# Patient Record
Sex: Female | Born: 1985 | Race: White | Hispanic: No | Smoking: Never smoker
Health system: Southern US, Community
[De-identification: ages and names within clinical notes are randomized; demographics above are authoritative.]

## PROBLEM LIST (undated history)

## (undated) DIAGNOSIS — IMO0002 Reserved for concepts with insufficient information to code with codable children: Secondary | ICD-10-CM

## (undated) DIAGNOSIS — R87619 Unspecified abnormal cytological findings in specimens from cervix uteri: Secondary | ICD-10-CM

## (undated) DIAGNOSIS — T7840XA Allergy, unspecified, initial encounter: Secondary | ICD-10-CM

## (undated) HISTORY — DX: Allergy, unspecified, initial encounter: T78.40XA

## (undated) HISTORY — DX: Unspecified abnormal cytological findings in specimens from cervix uteri: R87.619

## (undated) HISTORY — DX: Reserved for concepts with insufficient information to code with codable children: IMO0002

---

## 2010-12-27 ENCOUNTER — Ambulatory Visit (INDEPENDENT_AMBULATORY_CARE_PROVIDER_SITE_OTHER): Payer: Self-pay | Admitting: Physician Assistant

## 2010-12-27 ENCOUNTER — Encounter: Payer: Self-pay | Admitting: *Deleted

## 2010-12-27 VITALS — BP 119/82 | HR 90 | Temp 98.9°F | Ht 67.0 in | Wt 155.3 lb

## 2010-12-27 DIAGNOSIS — Z01812 Encounter for preprocedural laboratory examination: Secondary | ICD-10-CM

## 2010-12-27 MED ORDER — REPHRESH CLEAN BALANCE VA KIT: 1.0000 | PACK | Freq: Once | VAGINAL | Status: DC

## 2010-12-27 NOTE — Progress Notes (Signed)
Patient given informed consent, signed copy in the chart, time out was performed.  Placed in lithotomy position. Cervix viewed with speculum and colposcope after application of acetic acid.   Colposcopy adequate?  Yes Acetowhite lesions?yes, single lesion at 1 o'clock c/w CIN1 Punctation?no  Mosaicism? No Abnormal vasculature?  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

## 2010-12-27 NOTE — Progress Notes (Signed)
Addended by: Gerome Apley on: 12/27/2010 03:04 PM   Modules accepted: Orders

## 2010-12-27 NOTE — Patient Instructions (Signed)
Colposcopy Colposcopy is a procedure that uses a special lighted microscope (colposcope). It examines your cervix and vagina, or the area around the outside of the vagina, for signs of disease or abnormalities in the cells. You may be sent to a specialist (gynecologist) to do the colposcopy. A biopsy (tissue sample) may be collected during a colposcopy, if the caregiver finds any unusual cells. The biopsy is sent to the lab for further testing, and the results are reported back to your caregiver. A WOMAN MAY NEED THIS PROCEDURE IF:  She has had an abnormal pap smear (taking cells from the cervix for testing).   She has a sore on her cervix, and a Pap test was normal.   The Pap test suggests human papilloma virus (HPV). This virus can cause genital warts and is linked to the development of cervical cancer.   She has genital warts on the cervix, or in or around the outside of the vagina.   Her mother took the drug DES while pregnant.   She has painful intercourse.   She has vaginal bleeding, especially after sexual intercourse.   There is a need to evaluate the results of previous treatment.  BEFORE THE PROCEDURE   Colposcopy is done when you are not having a menstrual period.   For 24 hours before the colposcopy, do not:   Douche.   Use tampons.   Use medicines, creams, or suppositories in the vagina.   Have sexual intercourse.  PROCEDURE   A colposcopy is done while a woman is lying on her back with her feet in foot rests (stirrups).   A speculum is placed inside the vagina to keep it open and to allow the caregiver to see the cervix. This is the same instrument used to do a pap smear.   The colposcope is placed outside the vagina. It is used to magnify and examine the cervix, vagina, and the area around the outside of the vagina.   A small amount of liquid solution is placed on the area that is to be viewed. This solution is placed on with a cotton applicator. This solution  makes it easier to see the abnormal cells.   Your caregiver will suck out mucus and cells from the canal of the cervix.   Small pieces of tissue for biopsy may be taken at the same time. You may feel mild pain or discomfort when this is done.   Your caregiver will record the location of the abnormal areas and send the tissue samples to a lab for analysis.   If your caregiver biopsies the vagina or outside of the vagina, a local anesthetic (novocaine) is usually given.  AFTER THE PROCEDURE   You may have some cramping that often goes away in a few minutes. You may have some soreness for a couple of days.   You may take over-the-counter pain medicine as advised by your caregiver. Do not take aspirin because it can cause bleeding.   Lie down for a few minutes if you feel lightheaded.   You may have some bleeding or dark discharge that should stop in a few days.   You may need to wear a sanitary pad for a few days.  HOME CARE INSTRUCTIONS   Avoid sex, douching, and using tampons for a week or as directed.   Only take medicine as directed by your caregiver.   Continue to take birth control pills, if you are on them.   Not all test results are   available during your visit. If your test results are not back during the visit, make an appointment with your caregiver to find out the results. Do not assume everything is normal if you have not heard from your caregiver or the medical facility. It is important for you to follow up on all of your test results.   Follow your caregiver's advice regarding medicines, activity, follow-up visits, and follow-up Pap tests.  SEEK MEDICAL CARE IF:   You develop a rash.   You have problems with your medicine.  SEEK IMMEDIATE MEDICAL CARE IF:  You are bleeding heavily or are passing blood clots.   You develop a fever over 102 F (38.9 C), with or without chills.   You have abnormal vaginal discharge.   You are having cramps that do not go away  after taking your pain medicine.   You feel lightheaded, dizzy, or faint.   You develop stomach pain.  Document Released: 04/27/2002 Document Revised: 10/17/2010 Document Reviewed: 12/08/2008 Exit[employer] Patient Information 2012 [employer]. 

## 2013-02-25 ENCOUNTER — Encounter (HOSPITAL_COMMUNITY): Payer: Self-pay | Admitting: Emergency Medicine

## 2013-02-25 ENCOUNTER — Emergency Department (HOSPITAL_COMMUNITY)
Admission: EM | Admit: 2013-02-25 | Discharge: 2013-02-25 | Disposition: A | Discharge status: Home / Self Care | Payer: Medicaid Other | Source: Home / Self Care | Attending: Emergency Medicine | Admitting: Emergency Medicine

## 2013-02-25 DIAGNOSIS — J029 Acute pharyngitis, unspecified: Secondary | ICD-10-CM

## 2013-02-25 DIAGNOSIS — R05 Cough: Secondary | ICD-10-CM

## 2013-02-25 DIAGNOSIS — R059 Cough, unspecified: Secondary | ICD-10-CM

## 2013-02-25 LAB — POCT RAPID STREP A: Streptococcus, Group A Screen (Direct): NEGATIVE

## 2013-02-25 MED ORDER — CEFDINIR 250 MG/5ML PO SUSR: 300.0000 mg | Freq: Two times a day (BID) | ORAL | Status: DC

## 2013-02-25 NOTE — ED Provider Notes (Signed)
Medical screening examination/treatment/procedure(s) were performed by non-physician practitioner and as supervising physician I was immediately available for consultation/collaboration.  Maeleigh Buschman, M.D.  Holt Woolbright C Axel Frisk, MD 02/25/13 1531 

## 2013-02-25 NOTE — ED Notes (Signed)
Pt c/o sore throat onset yest and reports pain when she swallows Also c/o a productive cough... Denies: f/v/n/d She is alert w/no signs of acute distress.

## 2013-02-25 NOTE — ED Provider Notes (Signed)
CSN: [id number]     Arrival date & time 02/25/13  1143 History   First MD Initiated Contact with Patient 02/25/13 1332     Chief Complaint  Patient presents with  . Sore Throat   (Consider location/radiation/quality/duration/timing/severity/associated sxs/prior Treatment) HPI Comments: Sore throat and cough, started on Christmas. To the amoxicillin from dentist, got better. Finished amoxicillin, started getting worse. Pain in left neck with swallowing. Also productive cough. The cough got better with amoxicillin as well. Taking numerous OTC medications without relief.  Patient is a 28 y.o. female presenting with pharyngitis.  Sore Throat    Past Medical History  Diagnosis Date  . Abnormal Pap smear    History reviewed. No pertinent past surgical history. Family History  Problem Relation Age of Onset  . Diabetes Maternal Grandmother   . Cancer Maternal Grandmother     liver cancer  . Diabetes Father    History  Substance Use Topics  . Smoking status: Never Smoker   . Smokeless tobacco: Never Used  . Alcohol Use: No   OB History   Grav Para Term Preterm Abortions TAB SAB Ect Mult Living   1 1 1 0 0 0 0 0 0 1     Review of Systems  Allergies  Review of patient's allergies indicates no known allergies.  Home Medications   Current Outpatient Rx  Name  Route  Sig  Dispense  Refill  . cefdinir (OMNICEF) 250 MG/5ML suspension   Oral   Take 6 mLs (300 mg total) by mouth 2 (two) times daily.   120 mL   0   . levonorgestrel (MIRENA) 20 MCG/24HR IUD   Intrauterine   1 each by Intrauterine route once.           . Miscellaneous Vaginal Products (REPHRESH CLEAN BALANCE) KIT   Vaginal   Place 1 kit vaginally once.   1 kit   0   . omeprazole (PRILOSEC) 20 MG capsule   Oral   Take 20 mg by mouth daily.            BP 114/75  Pulse 70  Temp(Src) 98.1 F (36.7 C) (Oral)  Resp 18  SpO2 100% Physical Exam  Nursing note and vitals reviewed. Constitutional: She  is oriented to person, place, and time. Vital signs are normal. She appears well-developed and well-nourished. No distress.  HENT:  Head: Normocephalic and atraumatic.  Mouth/Throat: Oropharynx is clear and moist. No oropharyngeal exudate.  Cardiovascular: Normal rate, regular rhythm and normal heart sounds.  Exam reveals no gallop and no friction rub.   No murmur heard. Pulmonary/Chest: Effort normal and breath sounds normal. No respiratory distress. She has no wheezes. She has no rales.  Lymphadenopathy:    She has cervical adenopathy (left supperficial cervical).  Neurological: She is alert and oriented to person, place, and time. She has normal strength. Coordination normal.  Skin: Skin is warm and dry. No rash noted. She is not diaphoretic.  Psychiatric: She has a normal mood and affect. Judgment normal.    ED Course  Procedures (including critical care time) Labs Review Labs Reviewed  CULTURE, GROUP A STREP  POCT RAPID STREP A ([facility] CARE ONLY)   Imaging Review No results found.    MDM   1. Pharyngitis   2. Cough    Given recent amoxicillin, will change to omnicef.  Continue to treat symptomatically at home.  F/u PRN   Meds ordered this encounter  Medications  . cefdinir (OMNICEF)   250 MG/5ML suspension    Sig: Take 6 mLs (300 mg total) by mouth 2 (two) times daily.    Dispense:  120 mL    Refill:  0    Order Specific Question:  Supervising Provider    Answer:  Jake Michaelis, DAVID C [[id number]]       Liam Graham, PA-C 02/25/13 1406

## 2013-02-25 NOTE — Discharge Instructions (Signed)

## 2013-02-26 ENCOUNTER — Encounter (HOSPITAL_COMMUNITY): Payer: Self-pay | Admitting: Emergency Medicine

## 2013-02-26 ENCOUNTER — Emergency Department (INDEPENDENT_AMBULATORY_CARE_PROVIDER_SITE_OTHER)
Admission: EM | Admit: 2013-02-26 | Discharge: 2013-02-26 | Disposition: A | Discharge status: Home / Self Care | Payer: Medicaid Other | Source: Home / Self Care | Attending: Family Medicine | Admitting: Family Medicine

## 2013-02-26 DIAGNOSIS — T50905A Adverse effect of unspecified drugs, medicaments and biological substances, initial encounter: Secondary | ICD-10-CM

## 2013-02-26 DIAGNOSIS — T887XXA Unspecified adverse effect of drug or medicament, initial encounter: Secondary | ICD-10-CM

## 2013-02-26 NOTE — ED Provider Notes (Signed)
CSN: [id number]     Arrival date & time 02/26/13  8756 History   First MD Initiated Contact with Patient 02/26/13 0848     Chief Complaint  Patient presents with  . Medication Reaction   (Consider location/radiation/quality/duration/timing/severity/associated sxs/prior Treatment) Patient is a 28 y.o. female presenting with vomiting. The history is provided by the patient.  Emesis Severity:  Mild Progression:  Improving Chronicity:  New Context comment:  Given cefdinir yest at [facility] and became nauseated with each dose, stopped and sx better, feels fine now, no throat sx. Associated symptoms: no chills, no diarrhea, no fever and no sore throat     Past Medical History  Diagnosis Date  . Abnormal Pap smear    History reviewed. No pertinent past surgical history. Family History  Problem Relation Age of Onset  . Diabetes Maternal Grandmother   . Cancer Maternal Grandmother     liver cancer  . Diabetes Father    History  Substance Use Topics  . Smoking status: Never Smoker   . Smokeless tobacco: Never Used  . Alcohol Use: No   OB History   Grav Para Term Preterm Abortions TAB SAB Ect Mult Living   1 1 1  0 0 0 0 0 0 1     Review of Systems  Constitutional: Negative for chills.  HENT: Negative.  Negative for sore throat.   Gastrointestinal: Positive for vomiting. Negative for diarrhea.    Allergies  Cefdinir  Home Medications   Current Outpatient Rx  Name  Route  Sig  Dispense  Refill  . cefdinir (OMNICEF) 250 MG/5ML suspension   Oral   Take 6 mLs (300 mg total) by mouth 2 (two) times daily.   120 mL   0   . levonorgestrel (MIRENA) 20 MCG/24HR IUD   Intrauterine   1 each by Intrauterine route once.           . Miscellaneous Vaginal Products ([place] CLEAN BALANCE) KIT   Vaginal   Place 1 kit vaginally once.   1 kit   0   . omeprazole (PRILOSEC) 20 MG capsule   Oral   Take 20 mg by mouth daily.            BP 106/79  Pulse 90  Temp(Src) 98.2 F  (36.8 C) (Oral)  Resp 14  SpO2 99% Physical Exam  Nursing note and vitals reviewed. Constitutional: She is oriented to person, place, and time. She appears well-developed and well-nourished.  HENT:  Head: Normocephalic.  Right Ear: External ear normal.  Left Ear: External ear normal.  Mouth/Throat: Oropharynx is clear and moist. No oropharyngeal exudate.  Eyes: Pupils are equal, round, and reactive to light.  Neck: Normal range of motion. Neck supple.  Lymphadenopathy:    She has no cervical adenopathy.  Neurological: She is alert and oriented to person, place, and time.  Skin: Skin is warm and dry.    ED Course  Procedures (including critical care time) Labs Review Labs Reviewed - No data to display Imaging Review No results found.  EKG Interpretation    Date/Time:    Ventricular Rate:    PR Interval:    QRS Duration:   QT Interval:    QTC Calculation:   R Axis:     Text Interpretation:              MDM      Billy Fischer, MD 02/26/13 0930

## 2013-02-26 NOTE — ED Notes (Signed)
Pt was prescribed cefdinir and is having nausea.   Pt states that she tried taking medication with a meal but still having severe nausea.

## 2013-02-26 NOTE — Discharge Instructions (Signed)
Drink plenty of fluids as discussed, use salt water gargle and mucinex or delsym for cough. Return or see your doctor if further problems

## 2013-02-27 LAB — CULTURE, GROUP A STREP

## 2013-04-20 ENCOUNTER — Other Ambulatory Visit: Payer: Self-pay | Admitting: *Deleted

## 2013-04-20 DIAGNOSIS — T8332XA Displacement of intrauterine contraceptive device, initial encounter: Secondary | ICD-10-CM

## 2013-04-27 ENCOUNTER — Encounter: Payer: Self-pay | Admitting: *Deleted

## 2013-04-27 ENCOUNTER — Ambulatory Visit (HOSPITAL_COMMUNITY)
Admission: RE | Admit: 2013-04-27 | Discharge: 2013-04-27 | Disposition: A | Discharge status: Home / Self Care | Payer: Medicaid Other | Source: Ambulatory Visit | Attending: Obstetrics & Gynecology | Admitting: Obstetrics & Gynecology

## 2013-04-27 DIAGNOSIS — N854 Malposition of uterus: Secondary | ICD-10-CM | POA: Insufficient documentation

## 2013-04-27 DIAGNOSIS — Z30431 Encounter for routine checking of intrauterine contraceptive device: Secondary | ICD-10-CM | POA: Insufficient documentation

## 2013-04-27 DIAGNOSIS — T8332XA Displacement of intrauterine contraceptive device, initial encounter: Secondary | ICD-10-CM

## 2013-05-26 ENCOUNTER — Ambulatory Visit (INDEPENDENT_AMBULATORY_CARE_PROVIDER_SITE_OTHER): Payer: Medicaid Other | Admitting: Family Medicine

## 2013-05-26 ENCOUNTER — Encounter: Payer: Self-pay | Admitting: Family Medicine

## 2013-05-26 VITALS — BP 115/81 | HR 89 | Temp 98.3°F | Ht 66.0 in | Wt 146.1 lb

## 2013-05-26 DIAGNOSIS — IMO0001 Reserved for inherently not codable concepts without codable children: Secondary | ICD-10-CM

## 2013-05-26 DIAGNOSIS — Z309 Encounter for contraceptive management, unspecified: Secondary | ICD-10-CM

## 2013-05-26 DIAGNOSIS — Z30433 Encounter for removal and reinsertion of intrauterine contraceptive device: Secondary | ICD-10-CM

## 2013-05-26 LAB — POCT PREGNANCY, URINE: PREG TEST UR: NEGATIVE

## 2013-05-26 MED ORDER — LEVONORGESTREL 20 MCG/24HR IU IUD
INTRAUTERINE_SYSTEM | Freq: Once | INTRAUTERINE | Status: AC
  Administered 2013-05-26: 15:00:00 1 via INTRAUTERINE

## 2013-05-26 NOTE — Addendum Note (Signed)
Addended by: Faythe CasaBELLAMY, Renita Brocks M on: 05/26/2013 02:39 PM   Modules accepted: Orders

## 2013-05-26 NOTE — Patient Instructions (Signed)
Levonorgestrel intrauterine device (IUD) What is this medicine? LEVONORGESTREL IUD (LEE voe nor jes trel) is a contraceptive (birth control) device. The device is placed inside the uterus by a healthcare professional. It is used to prevent pregnancy and can also be used to treat heavy bleeding that occurs during your period. Depending on the device, it can be used for 3 to 5 years. This medicine may be used for other purposes; ask your health care provider or pharmacist if you have questions. COMMON BRAND NAME(S): Mirena, Skyla What should I tell my health care provider before I take this medicine? They need to know if you have any of these conditions: -abnormal Pap smear -cancer of the breast, uterus, or cervix -diabetes -endometritis -genital or pelvic infection now or in the past -have more than one sexual partner or your partner has more than one partner -heart disease -history of an ectopic or tubal pregnancy -immune system problems -IUD in place -liver disease or tumor -problems with blood clots or take blood-thinners -use intravenous drugs -uterus of unusual shape -vaginal bleeding that has not been explained -an unusual or allergic reaction to levonorgestrel, other hormones, silicone, or polyethylene, medicines, foods, dyes, or preservatives -pregnant or trying to get pregnant -breast-feeding How should I use this medicine? This device is placed inside the uterus by a health care professional. Talk to your pediatrician regarding the use of this medicine in children. Special care may be needed. Overdosage: If you think you have taken too much of this medicine contact a poison control center or emergency room at once. NOTE: This medicine is only for you. Do not share this medicine with others. What if I miss a dose? This does not apply. What may interact with this medicine? Do not take this medicine with any of the following  medications: -amprenavir -bosentan -fosamprenavir This medicine may also interact with the following medications: -aprepitant -barbiturate medicines for inducing sleep or treating seizures -bexarotene -griseofulvin -medicines to treat seizures like carbamazepine, ethotoin, felbamate, oxcarbazepine, phenytoin, topiramate -modafinil -pioglitazone -rifabutin -rifampin -rifapentine -some medicines to treat HIV infection like atazanavir, indinavir, lopinavir, nelfinavir, tipranavir, ritonavir -St. John's wort -warfarin This list may not describe all possible interactions. Give your health care provider a list of all the medicines, herbs, non-prescription drugs, or dietary supplements you use. Also tell them if you smoke, drink alcohol, or use illegal drugs. Some items may interact with your medicine. What should I watch for while using this medicine? Visit your doctor or health care professional for regular check ups. See your doctor if you or your partner has sexual contact with others, becomes HIV positive, or gets a sexual transmitted disease. This product does not protect you against HIV infection (AIDS) or other sexually transmitted diseases. You can check the placement of the IUD yourself by reaching up to the top of your vagina with clean fingers to feel the threads. Do not pull on the threads. It is a good habit to check placement after each menstrual period. Call your doctor right away if you feel more of the IUD than just the threads or if you cannot feel the threads at all. The IUD may come out by itself. You may become pregnant if the device comes out. If you notice that the IUD has come out use a backup birth control method like condoms and call your health care provider. Using tampons will not change the position of the IUD and are okay to use during your period. What side effects may I   notice from receiving this medicine? Side effects that you should report to your doctor or  health care professional as soon as possible: -allergic reactions like skin rash, itching or hives, swelling of the face, lips, or tongue -fever, flu-like symptoms -genital sores -high blood pressure -no menstrual period for 6 weeks during use -pain, swelling, warmth in the leg -pelvic pain or tenderness -severe or sudden headache -signs of pregnancy -stomach cramping -sudden shortness of breath -trouble with balance, talking, or walking -unusual vaginal bleeding, discharge -yellowing of the eyes or skin Side effects that usually do not require medical attention (report to your doctor or health care professional if they continue or are bothersome): -acne -breast pain -change in sex drive or performance -changes in weight -cramping, dizziness, or faintness while the device is being inserted -headache -irregular menstrual bleeding within first 3 to 6 months of use -nausea This list may not describe all possible side effects. Call your doctor for medical advice about side effects. You may report side effects to FDA at 1-800-FDA-1088. Where should I keep my medicine? This does not apply. NOTE: This sheet is a summary. It may not cover all possible information. If you have questions about this medicine, talk to your doctor, pharmacist, or health care provider.  2014, Elsevier/Gold Standard. (2011-03-07 13:54:04)  

## 2013-05-26 NOTE — Progress Notes (Signed)
    Subjective:    Patient ID: Marcene CorningHeather Loscalzo is a 28 y.o. female presenting with Contraception  on 05/26/2013  HPI: Here for IUD change--previous Mirena in x 5 years.  No cycle.  Review of Systems  Constitutional: Negative for fever and chills.  Respiratory: Negative for shortness of breath.   Cardiovascular: Negative for chest pain.  Gastrointestinal: Negative for nausea, vomiting and abdominal pain.  Genitourinary: Negative for dysuria.  Skin: Negative for rash.      Objective:    BP 115/81  Pulse 89  Temp(Src) 98.3 F (36.8 C)  Ht 5\' 6"  (1.676 m)  Wt 146 lb 1.6 oz (66.271 kg)  BMI 23.59 kg/m2 Physical Exam  Constitutional: She is oriented to person, place, and time. She appears well-developed and well-nourished. No distress.  HENT:  Head: Normocephalic and atraumatic.  Eyes: No scleral icterus.  Neck: Neck supple.  Cardiovascular: Normal rate.   Pulmonary/Chest: Effort normal.  Abdominal: Soft.  Neurological: She is alert and oriented to person, place, and time.  Skin: Skin is warm and dry.  Psychiatric: She has a normal mood and affect.      Subjective: Patient here for IUD change.  Has been in for 5 years and due to be changed. She has no cycles and desires to continue Mirena IUD for contraception.  Objective: Filed Vitals:   05/26/13 1400  BP: 115/81  Pulse: 89  Temp: 98.3 F (36.8 C)    Patient identified, informed consent performed, signed copy in chart, time out was performed Procedure: Speculum placed inside vagina.  Cervix visualized.  Strings grasped with ring forceps.  IUD removed intact.  Procedure: Cervix visualized.  Cleaned with Betadine x 2.  Grasped anteriourly with a single tooth tenaculum.  Uterus sounded to 8 cm.  Mirena IUD placed per manufacturer's recommendations.  Strings trimmed to 3 cm.   Patient given post procedure instructions and Mirena care card with expiration date.  Patient is asked to check IUD strings periodically  and follow up in 4-6 weeks for IUD check.  Impression: Birth control   Plan: Continue IUD for 5 years.

## 2013-10-12 ENCOUNTER — Encounter: Payer: Self-pay | Admitting: General Practice

## 2013-11-11 ENCOUNTER — Emergency Department (HOSPITAL_COMMUNITY)
Admission: EM | Admit: 2013-11-11 | Discharge: 2013-11-11 | Disposition: A | Discharge status: Home / Self Care | Payer: Medicaid Other | Source: Home / Self Care

## 2013-11-11 ENCOUNTER — Encounter (HOSPITAL_COMMUNITY): Payer: Self-pay | Admitting: Emergency Medicine

## 2013-11-11 DIAGNOSIS — J029 Acute pharyngitis, unspecified: Secondary | ICD-10-CM

## 2013-11-11 DIAGNOSIS — J3089 Other allergic rhinitis: Secondary | ICD-10-CM

## 2013-11-11 DIAGNOSIS — J302 Other seasonal allergic rhinitis: Secondary | ICD-10-CM

## 2013-11-11 MED ORDER — PREDNISONE 5 MG/5ML PO SOLN: ORAL | Status: DC

## 2013-11-11 NOTE — ED Notes (Signed)
Pt  Reports  Symptoms    Of  Cough   Congested      Sinus    Drainage     And  stuffyness  To     r  Side  Of  Face        Pt  Was  Seen  About  10  Days  Ago        By  Her PCP         And  Was  rx  meds

## 2013-11-11 NOTE — Discharge Instructions (Signed)
Allergic Rhinitis Use lots of nasal saline frequently COntinue daily flonase Start phenylephrine 1% nasal spray as directed May want to change to allegra or Zyrtec for drainage and allergies Sudafed PE 10 mg every 4 hours for congestion   Sinusitis   Sinusitis is redness, soreness, and puffiness (inflammation) of the air pockets in the bones of your face (sinuses). The redness, soreness, and puffiness can cause air and mucus to get trapped in your sinuses. This can allow germs to grow and cause an infection.  HOME CARE   Drink enough fluids to keep your pee (urine) clear or pale yellow.  Use a humidifier in your home.  Run a hot shower to create steam in the bathroom. Sit in the bathroom with the door closed. Breathe in the steam 3-4 times a day.  Put a warm, moist washcloth on your face 3-4 times a day, or as told by your doctor.  Use salt water sprays (saline sprays) to wet the thick fluid in your nose. This can help the sinuses drain.  Only take medicine as told by your doctor. GET HELP RIGHT AWAY IF:   Your pain gets worse.  You have very bad headaches.  You are sick to your stomach (nauseous).  You throw up (vomit).  You are very sleepy (drowsy) all the time.  Your face is puffy (swollen).  Your vision changes.  You have a stiff neck.  You have trouble breathing. MAKE SURE YOU:   Understand these instructions.  Will watch your condition.  Will get help right away if you are not doing well or get worse. Document Released: 07/24/2007 Document Revised: 10/30/2011 Document Reviewed: 09/10/2011 [facility] Patient Information 2015 [place], [place]. This information is not intended to replace advice given to you by your health care provider. Make sure you discuss any questions you have with your health care provider.   Allergic rhinitis is when the mucous membranes in the nose respond to allergens. Allergens are particles in the air that cause your body to have an  allergic reaction. This causes you to release allergic antibodies. Through a chain of events, these eventually cause you to release histamine into the blood stream. Although meant to protect the body, it is this release of histamine that causes your discomfort, such as frequent sneezing, congestion, and an itchy, runny nose.  CAUSES  Seasonal allergic rhinitis (hay fever) is caused by pollen allergens that may come from grasses, trees, and weeds. Year-round allergic rhinitis (perennial allergic rhinitis) is caused by allergens such as house dust mites, pet dander, and mold spores.  SYMPTOMS   Nasal stuffiness (congestion).  Itchy, runny nose with sneezing and tearing of the eyes. DIAGNOSIS  Your health care provider can help you determine the allergen or allergens that trigger your symptoms. If you and your health care provider are unable to determine the allergen, skin or blood testing may be used. TREATMENT  Allergic rhinitis does not have a cure, but it can be controlled by:  Medicines and allergy shots (immunotherapy).  Avoiding the allergen. Hay fever may often be treated with antihistamines in pill or nasal spray forms. Antihistamines block the effects of histamine. There are over-the-counter medicines that may help with nasal congestion and swelling around the eyes. Check with your health care provider before taking or giving this medicine.  If avoiding the allergen or the medicine prescribed do not work, there are many new medicines your health care provider can prescribe. Stronger medicine may be used if initial measures  are ineffective. Desensitizing injections can be used if medicine and avoidance does not work. Desensitization is when a patient is given ongoing shots until the body becomes less sensitive to the allergen. Make sure you follow up with your health care provider if problems continue. HOME CARE INSTRUCTIONS It is not possible to completely avoid allergens, but you can  reduce your symptoms by taking steps to limit your exposure to them. It helps to know exactly what you are allergic to so that you can avoid your specific triggers. SEEK MEDICAL CARE IF:   You have a fever.  You develop a cough that does not stop easily (persistent).  You have shortness of breath.  You start wheezing.  Symptoms interfere with normal daily activities. Document Released: 10/30/2000 Document Revised: 02/09/2013 Document Reviewed: 10/12/2012 [facility] Patient Information 2015 [place], [place]. This information is not intended to replace advice given to you by your health care provider. Make sure you discuss any questions you have with your health care provider.

## 2013-11-11 NOTE — ED Provider Notes (Signed)
CSN: [id number]     Arrival date & time 11/11/13  8280 History   First MD Initiated Contact with Patient 11/11/13 [phone]     Chief Complaint  Patient presents with  . URI   (Consider location/radiation/quality/duration/timing/severity/associated sxs/prior Treatment) HPI Comments: C/O nasal stuffiness L>R, sinus congestion that started in mid August. Went to PCP and tx with OTC meds and Albuterol HFA, Flonase. USed Afrin once last PM..did not work. Still has cough and sinus sx's . No fever.   Past Medical History  Diagnosis Date  . Abnormal Pap smear    History reviewed. No pertinent past surgical history. Family History  Problem Relation Age of Onset  . Diabetes Maternal Grandmother   . Cancer Maternal Grandmother     liver cancer  . Diabetes Father    History  Substance Use Topics  . Smoking status: Never Smoker   . Smokeless tobacco: Never Used  . Alcohol Use: No   OB History   Grav Para Term Preterm Abortions TAB SAB Ect Mult Living   1 1 1  0 0 0 0 0 0 1     Review of Systems  Constitutional: Negative for fever, chills, activity change, appetite change and fatigue.  HENT: Positive for congestion, postnasal drip, rhinorrhea and sinus pressure. Negative for facial swelling.   Eyes: Negative.   Respiratory: Positive for cough.   Cardiovascular: Negative.   Gastrointestinal: Negative.   Musculoskeletal: Negative for neck pain and neck stiffness.  Skin: Negative for pallor and rash.  Neurological: Negative.     Allergies  Cefdinir  Home Medications   Prior to Admission medications   Medication Sig Start Date End Date Taking? Authorizing Provider  cefdinir (OMNICEF) 250 MG/5ML suspension Take 6 mLs (300 mg total) by mouth 2 (two) times daily. 02/25/13   Liam Graham, PA-C  levonorgestrel (MIRENA) 20 MCG/24HR IUD 1 each by Intrauterine route once.      Historical Provider, MD  Miscellaneous Vaginal Products ([place]) KIT Place 1 kit vaginally once.  12/27/10   Doren Custard, CNM  omeprazole (PRILOSEC) 20 MG capsule Take 20 mg by mouth daily.      Historical Provider, MD  predniSONE 5 MG/5ML solution 40 mg daily for 6 days 11/11/13   Janne Napoleon, NP   BP 132/70  Pulse 78  Temp(Src) 98.6 F (37 C) (Oral)  Resp 18  SpO2 100% Physical Exam  Nursing note and vitals reviewed. Constitutional: She is oriented to person, place, and time. She appears well-developed and well-nourished. No distress.  HENT:  Head: Normocephalic.  Right Ear: External ear normal.  Left Ear: External ear normal.  Mouth/Throat: No oropharyngeal exudate.  L TM retracted. R nl. No erthema. OP with minor redness. Does not appear infectious.  Eyes: Conjunctivae are normal.  Neck: Normal range of motion. Neck supple.  Cardiovascular: Normal rate and regular rhythm.   No murmur heard. Pulmonary/Chest: Effort normal and breath sounds normal. No respiratory distress. She has no wheezes. She has no rales.  Musculoskeletal: Normal range of motion. She exhibits no edema.  Lymphadenopathy:    She has no cervical adenopathy.  Neurological: She is alert and oriented to person, place, and time. No cranial nerve deficit.  Skin: Skin is warm and dry. No rash noted.  Psychiatric: She has a normal mood and affect.    ED Course  Procedures (including critical care time) Labs Review Labs Reviewed - No data to display  Imaging Review No results found.  MDM   1. Other seasonal allergic rhinitis   2. Allergic pharyngitis    Prednisone liquid  Allergic Rhinitis Use lots of nasal saline frequently COntinue daily flonase Start phenylephrine 1% nasal spray as directed May want to change to allegra or Zyrtec for drainage and allergies Sudafed PE 10 mg every 4 hours for congestion     Janne Napoleon, NP 11/11/13 1023

## 2013-11-13 NOTE — ED Provider Notes (Signed)
Medical screening examination/treatment/procedure(s) were performed by a resident physician or non-physician practitioner and as the supervising physician I was immediately available for consultation/collaboration.  Yanin Muhlestein, MD    Khaya Theissen S Jayesh Marbach, MD 11/13/13 0852 

## 2013-12-20 ENCOUNTER — Encounter (HOSPITAL_COMMUNITY): Payer: Self-pay | Admitting: Emergency Medicine

## 2014-04-24 ENCOUNTER — Emergency Department (HOSPITAL_COMMUNITY)
Admission: EM | Admit: 2014-04-24 | Discharge: 2014-04-24 | Disposition: A | Discharge status: Home / Self Care | Payer: Medicaid Other | Source: Home / Self Care | Attending: Family Medicine | Admitting: Family Medicine

## 2014-04-24 ENCOUNTER — Encounter (HOSPITAL_COMMUNITY): Payer: Self-pay

## 2014-04-24 DIAGNOSIS — J069 Acute upper respiratory infection, unspecified: Secondary | ICD-10-CM | POA: Diagnosis not present

## 2014-04-24 MED ORDER — AZITHROMYCIN 200 MG/5ML PO SUSR: ORAL | Status: DC

## 2014-04-24 MED ORDER — PREDNISOLONE SODIUM PHOSPHATE 15 MG/5ML PO SOLN: 60.0000 mg | Freq: Every day | ORAL | Status: DC

## 2014-04-24 NOTE — Discharge Instructions (Signed)
Start antibiotic if you're not getting any better in 5 more days  Upper Respiratory Infection, Adult An upper respiratory infection (URI) is also sometimes known as the common cold. The upper respiratory tract includes the nose, sinuses, throat, trachea, and bronchi. Bronchi are the airways leading to the lungs. Most people improve within 1 week, but symptoms can last up to 2 weeks. A residual cough may last even longer.  CAUSES Many different viruses can infect the tissues lining the upper respiratory tract. The tissues become irritated and inflamed and often become very moist. Mucus production is also common. A cold is contagious. You can easily spread the virus to others by oral contact. This includes kissing, sharing a glass, coughing, or sneezing. Touching your mouth or nose and then touching a surface, which is then touched by another person, can also spread the virus. SYMPTOMS  Symptoms typically develop 1 to 3 days after you come in contact with a cold virus. Symptoms vary from person to person. They may include:  Runny nose.  Sneezing.  Nasal congestion.  Sinus irritation.  Sore throat.  Loss of voice (laryngitis).  Cough.  Fatigue.  Muscle aches.  Loss of appetite.  Headache.  Low-grade fever. DIAGNOSIS  You might diagnose your own cold based on familiar symptoms, since most people get a cold 2 to 3 times a year. Your caregiver can confirm this based on your exam. Most importantly, your caregiver can check that your symptoms are not due to another disease such as strep throat, sinusitis, pneumonia, asthma, or epiglottitis. Blood tests, throat tests, and X-rays are not necessary to diagnose a common cold, but they may sometimes be helpful in excluding other more serious diseases. Your caregiver will decide if any further tests are required. RISKS AND COMPLICATIONS  You may be at risk for a more severe case of the common cold if you smoke cigarettes, have chronic heart  disease (such as heart failure) or lung disease (such as asthma), or if you have a weakened immune system. The very young and very old are also at risk for more serious infections. Bacterial sinusitis, middle ear infections, and bacterial pneumonia can complicate the common cold. The common cold can worsen asthma and chronic obstructive pulmonary disease (COPD). Sometimes, these complications can require emergency medical care and may be life-threatening. PREVENTION  The best way to protect against getting a cold is to practice good hygiene. Avoid oral or hand contact with people with cold symptoms. Wash your hands often if contact occurs. There is no clear evidence that vitamin C, vitamin E, echinacea, or exercise reduces the chance of developing a cold. However, it is always recommended to get plenty of rest and practice good nutrition. TREATMENT  Treatment is directed at relieving symptoms. There is no cure. Antibiotics are not effective, because the infection is caused by a virus, not by bacteria. Treatment may include:  Increased fluid intake. Sports drinks offer valuable electrolytes, sugars, and fluids.  Breathing heated mist or steam (vaporizer or shower).  Eating chicken soup or other clear broths, and maintaining good nutrition.  Getting plenty of rest.  Using gargles or lozenges for comfort.  Controlling fevers with ibuprofen or acetaminophen as directed by your caregiver.  Increasing usage of your inhaler if you have asthma. Zinc gel and zinc lozenges, taken in the first 24 hours of the common cold, can shorten the duration and lessen the severity of symptoms. Pain medicines may help with fever, muscle aches, and throat pain. A variety  of non-prescription medicines are available to treat congestion and runny nose. Your caregiver can make recommendations and may suggest nasal or lung inhalers for other symptoms.  HOME CARE INSTRUCTIONS   Only take over-the-counter or prescription  medicines for pain, discomfort, or fever as directed by your caregiver.  Use a warm mist humidifier or inhale steam from a shower to increase air moisture. This may keep secretions moist and make it easier to breathe.  Drink enough water and fluids to keep your urine clear or pale yellow.  Rest as needed.  Return to work when your temperature has returned to normal or as your caregiver advises. You may need to stay home longer to avoid infecting others. You can also use a face mask and careful hand washing to prevent spread of the virus. SEEK MEDICAL CARE IF:   After the first few days, you feel you are getting worse rather than better.  You need your caregiver's advice about medicines to control symptoms.  You develop chills, worsening shortness of breath, or brown or red sputum. These may be signs of pneumonia.  You develop yellow or brown nasal discharge or pain in the face, especially when you bend forward. These may be signs of sinusitis.  You develop a fever, swollen neck glands, pain with swallowing, or white areas in the back of your throat. These may be signs of strep throat. SEEK IMMEDIATE MEDICAL CARE IF:   You have a fever.  You develop severe or persistent headache, ear pain, sinus pain, or chest pain.  You develop wheezing, a prolonged cough, cough up blood, or have a change in your usual mucus (if you have chronic lung disease).  You develop sore muscles or a stiff neck. Document Released: 07/31/2000 Document Revised: 04/29/2011 Document Reviewed: 05/12/2013 [facility] Patient Information 2015 [place], [place]. This information is not intended to replace advice given to you by your health care provider. Make sure you discuss any questions you have with your health care provider.

## 2014-04-24 NOTE — ED Provider Notes (Signed)
CSN: [id number]     Arrival date & time 04/24/14  0903 History   First MD Initiated Contact with Patient 04/24/14 [phone]     Chief Complaint  Patient presents with  . Facial Pain   (Consider location/radiation/quality/duration/timing/severity/associated sxs/prior Treatment) HPI        29 year old female presents complaining of being sick for about 5 days. She has cough, sore throat, mild headache. She also has some rhinorrhea and postnasal drip. Yesterday she started to lose her voice. Her daughter has a similar illness, she is being treated for sinus infection. No fever, chills, chest pain, shortness of breath. Cough is dry and nonproductive. She is taking over-the-counter medications with some relief of her symptoms  Past Medical History  Diagnosis Date  . Abnormal Pap smear    History reviewed. No pertinent past surgical history. Family History  Problem Relation Age of Onset  . Diabetes Maternal Grandmother   . Cancer Maternal Grandmother     liver cancer  . Diabetes Father    History  Substance Use Topics  . Smoking status: Never Smoker   . Smokeless tobacco: Never Used  . Alcohol Use: No   OB History    Gravida Para Term Preterm AB TAB SAB Ectopic Multiple Living   1 1 1  0 0 0 0 0 0 1     Review of Systems  Constitutional: Negative for fever and chills.  HENT: Positive for congestion, postnasal drip, rhinorrhea and sore throat. Negative for ear pain and sinus pressure.   Respiratory: Positive for cough. Negative for shortness of breath.   Cardiovascular: Negative for chest pain and leg swelling.  All other systems reviewed and are negative.   Allergies  Cefdinir  Home Medications   Prior to Admission medications   Medication Sig Start Date End Date Taking? Authorizing Provider  azithromycin (ZITHROMAX) 200 MG/5ML suspension 12.5 mL (500 mg) on day 1, then 6.3 mL (250 mg) daily for the next 4 days for a total of 5 days 04/24/14   Liam Graham, PA-C  cefdinir  (OMNICEF) 250 MG/5ML suspension Take 6 mLs (300 mg total) by mouth 2 (two) times daily. 02/25/13   Liam Graham, PA-C  levonorgestrel (MIRENA) 20 MCG/24HR IUD 1 each by Intrauterine route once.      Historical Provider, MD  Miscellaneous Vaginal Products ([place]) KIT Place 1 kit vaginally once. 12/27/10   Doren Custard, CNM  omeprazole (PRILOSEC) 20 MG capsule Take 20 mg by mouth daily.      Historical Provider, MD  prednisoLONE (ORAPRED) 15 MG/5ML solution Take 20 mLs (60 mg total) by mouth daily before breakfast. 04/24/14   Liam Graham, PA-C  predniSONE 5 MG/5ML solution 40 mg daily for 6 days 11/11/13   Janne Napoleon, NP   BP 110/64 mmHg  Pulse 90  Temp(Src) 98 F (36.7 C) (Oral)  Resp 16  SpO2 100% Physical Exam  Constitutional: She is oriented to person, place, and time. Vital signs are normal. She appears well-developed and well-nourished. No distress.  HENT:  Head: Normocephalic and atraumatic.  Right Ear: External ear normal.  Left Ear: External ear normal.  Nose: Nose normal. Right sinus exhibits no maxillary sinus tenderness and no frontal sinus tenderness. Left sinus exhibits no maxillary sinus tenderness and no frontal sinus tenderness.  Mouth/Throat: Oropharynx is clear and moist. No oropharyngeal exudate.  Eyes: Conjunctivae are normal.  Neck: Normal range of motion. Neck supple.  Cardiovascular: Normal rate, regular rhythm and normal  heart sounds.   Pulmonary/Chest: Effort normal and breath sounds normal. No respiratory distress.  Lymphadenopathy:    She has no cervical adenopathy.  Neurological: She is alert and oriented to person, place, and time. She has normal strength. Coordination normal.  Skin: Skin is warm and dry. No rash noted. She is not diaphoretic.  Psychiatric: She has a normal mood and affect. Judgment normal.  Nursing note and vitals reviewed.   ED Course  Procedures (including critical care time) Labs Review Labs Reviewed - No data  to display  Imaging Review No results found.   MDM   1. URI (upper respiratory infection)    Physical exam is normal. Continue to treat symptomatically, start antibiotics if no improvement in 5 days. Follow-up when necessary   Meds ordered this encounter  Medications  . prednisoLONE (ORAPRED) 15 MG/5ML solution    Sig: Take 20 mLs (60 mg total) by mouth daily before breakfast.    Dispense:  60 mL    Refill:  0    Order Specific Question:  Supervising Provider    Answer:  Billy Fischer [phone]  . azithromycin (ZITHROMAX) 200 MG/5ML suspension    Sig: 12.5 mL (500 mg) on day 1, then 6.3 mL (250 mg) daily for the next 4 days for a total of 5 days    Dispense:  40 mL    Refill:  0    Order Specific Question:  Supervising Provider    Answer:  Ihor Gully D [place], PA-C 04/24/14 [phone]

## 2014-04-24 NOTE — ED Notes (Signed)
Patient complains of sore throat runny nose congestion and cough that Started about four days ago. Patient states yesterday she started to loose her voice

## 2014-04-25 LAB — POCT RAPID STREP A: Streptococcus, Group A Screen (Direct): NEGATIVE

## 2014-04-26 LAB — CULTURE, GROUP A STREP: STREP A CULTURE: NEGATIVE

## 2015-05-02 ENCOUNTER — Ambulatory Visit (INDEPENDENT_AMBULATORY_CARE_PROVIDER_SITE_OTHER): Payer: Self-pay | Admitting: Family Medicine

## 2015-05-02 VITALS — BP 108/78 | HR 103 | Temp 99.8°F | Resp 16 | Ht 66.0 in | Wt 160.4 lb

## 2015-05-02 DIAGNOSIS — R509 Fever, unspecified: Secondary | ICD-10-CM

## 2015-05-02 DIAGNOSIS — Z20828 Contact with and (suspected) exposure to other viral communicable diseases: Secondary | ICD-10-CM

## 2015-05-02 DIAGNOSIS — J029 Acute pharyngitis, unspecified: Secondary | ICD-10-CM

## 2015-05-02 LAB — POCT RAPID STREP A (OFFICE): Rapid Strep A Screen: NEGATIVE

## 2015-05-02 LAB — POCT INFLUENZA A/B
INFLUENZA A, POC: NEGATIVE
Influenza B, POC: NEGATIVE

## 2015-05-02 MED ORDER — OSELTAMIVIR PHOSPHATE 75 MG PO CAPS: 75.0000 mg | ORAL_CAPSULE | Freq: Two times a day (BID) | ORAL | Status: DC

## 2015-05-02 MED ORDER — MAGIC MOUTHWASH W/LIDOCAINE: 5.0000 mL | Freq: Four times a day (QID) | ORAL | Status: DC | PRN

## 2015-05-02 NOTE — Progress Notes (Signed)
Subjective:  This chart was scribed for Merri Ray MD,  by Tamsen Roers, at [facility]. This patient was seen in room 1 and the patient's care was started at 8:29 AM.   Chief Complaint  Patient presents with  . Fever    x sunday  . Sore Throat    x sunday  . Otalgia    x sunday both ears  . Cough     Patient ID: Carrie Richardson, female    DOB: 03-17-1985, 30 y.o.   MRN: [id number]  HPI  HPI Comments: Carrie Richardson is a 30 y.o. female who presents to the [facility] complaining of a sore throat and cough onset two days ago.  Patient states that her throat is very sore and it now hurts to swallow due to the pain. She has associated symptoms of a subjective low grade fever (98.9-99.7). She has used over the counter cough medication but denies any relief. Patient did not receive a flu shot this year.  Patient has an inhaler at home and has tried it at times when she had difficulty breathing. She mainly uses it when her "allergies act up". She currently does not have insurance coverage.   Patients daughter was running a fever 5 days ago and was diagnosed with the flu.  She also believes that her husband has similar symptoms.   There are no active problems to display for this patient.  Past Medical History  Diagnosis Date  . Abnormal Pap smear   . Allergy    History reviewed. No pertinent past surgical history. Allergies  Allergen Reactions  . Cefdinir     nausea   Prior to Admission medications   Medication Sig Start Date End Date Taking? Authorizing Provider  levonorgestrel (MIRENA) 20 MCG/24HR IUD 1 each by Intrauterine route once.     Yes Historical Provider, MD  cefdinir (OMNICEF) 250 MG/5ML suspension Take 6 mLs (300 mg total) by mouth 2 (two) times daily. Patient not taking: Reported on 05/02/2015 02/25/13   Liam Graham, PA-C  Miscellaneous Vaginal Products ([place]) KIT Place 1 kit vaginally  once. Patient not taking: Reported on 05/02/2015 12/27/10   Doren Custard, CNM  omeprazole (PRILOSEC) 20 MG capsule Take 20 mg by mouth daily. Reported on 05/02/2015    Historical Provider, MD   Social History   Social History  . Marital Status: Married    Spouse Name: N/A  . Number of Children: N/A  . Years of Education: N/A   Occupational History  . Not on file.   Social History Main Topics  . Smoking status: Never Smoker   . Smokeless tobacco: Never Used  . Alcohol Use: No  . Drug Use: No  . Sexual Activity: Yes    Birth Control/ Protection: IUD   Other Topics Concern  . Not on file   Social History Narrative     Review of Systems  Constitutional: Positive for fever.  HENT: Positive for ear pain and sore throat.   Eyes: Negative for pain, discharge, redness and itching.  Respiratory: Positive for cough. Negative for choking and shortness of breath.   Gastrointestinal: Negative for nausea and vomiting.  Musculoskeletal: Negative for neck pain and neck stiffness.  Neurological: Negative for seizures, syncope and speech difficulty.       Objective:   Physical Exam  Constitutional: She is oriented to person, place, and time. She appears well-developed and well-nourished. No distress.  HENT:  Head: Normocephalic and atraumatic.  Right Ear: Hearing, tympanic membrane, external ear and ear canal normal.  Left Ear: Hearing, tympanic membrane, external ear and ear canal normal.  Nose: Nose normal.  Mouth/Throat: Oropharynx is clear and moist. No oropharyngeal exudate.  Minimal erythema posterior oropharynx, no exudate.   Eyes: Conjunctivae and EOM are normal. Pupils are equal, round, and reactive to light.  Cardiovascular: Normal rate, regular rhythm, normal heart sounds and intact distal pulses.   No murmur heard. Pulmonary/Chest: Effort normal and breath sounds normal. No respiratory distress. She has no wheezes. She has no rhonchi.  Neurological: She is alert and  oriented to person, place, and time.  Skin: Skin is warm and dry. No rash noted.  Psychiatric: She has a normal mood and affect. Her behavior is normal.  Vitals reviewed.   Filed Vitals:   05/02/15 0826  BP: 108/78  Pulse: 103  Temp: 99.8 F (37.7 C)  TempSrc: Oral  Resp: 16  Height: 5' 6"  (1.676 m)  Weight: 160 lb 6.4 oz (72.757 kg)  SpO2: 97%    Results for orders placed or performed in visit on 05/02/15  POCT Influenza A/B  Result Value Ref Range   Influenza A, POC Negative Negative   Influenza B, POC Negative Negative  POCT rapid strep A  Result Value Ref Range   Rapid Strep A Screen Negative Negative        Assessment & Plan:   Carrie Richardson is a 30 y.o. female Sore throat - Plan: POCT Influenza A/B, POCT rapid strep A, magic mouthwash w/lidocaine SOLN  Fever, unspecified - Plan: POCT Influenza A/B, POCT rapid strep A, oseltamivir (TAMIFLU) 75 MG capsule  Exposure to influenza - Plan: oseltamivir (TAMIFLU) 75 MG capsule  Suspected viral illness, but exposure to flu. Possible false-negative flu testing. No signs of strep on exam. Rx for Tamiflu given if she chooses to fill it this morning, but again discussed that this may be other virus, not flu. Symptomatic care discussed for her sore throat, including ibuprofen, fluids, tea, honey, Cepacol cough drops. Magic mouthwash prescribed if needed. RTC precautions.  Meds ordered this encounter  Medications  . oseltamivir (TAMIFLU) 75 MG capsule    Sig: Take 1 capsule (75 mg total) by mouth 2 (two) times daily.    Dispense:  10 capsule    Refill:  0  . magic mouthwash w/lidocaine SOLN    Sig: Take 5 mLs by mouth 4 (four) times daily as needed for mouth pain.    Dispense:  120 mL    Refill:  0    Ok to substitute ingredients per pharmacy usual "magic mouthwash" prep.   Patient Instructions  IF you received an x-ray today, you will receive an invoice from [facility]. Please contact [facility]  at [phone] with questions or concerns regarding your invoice.   IF you received labwork today, you will receive an invoice from [employer]. Please contact [facility] at [phone] with questions or concerns regarding your invoice.   Our billing staff will not be able to assist you with questions regarding bills from these companies.  You will be contacted with the lab results as soon as they are available. The fastest way to get your results is to activate your My Chart account. Instructions are located on the last page of this paperwork. If you have not heard from [place] regarding the results in 2 weeks, please contact this office.   Your flu and strep tests were  both normal, however these are rarely falsely negative. Your current symptoms appear to be due to another virus, but could take Tamiflu for possible false-negative flu test. This is up to you and can be decided once get to the pharmacy. For sore throat, use Cepacol or other cough drops, tea, honey, ibuprofen as discussed. Also wrote for Magic mouthwash if you need it.  Return to the clinic or go to the nearest emergency room if any of your symptoms worsen or new symptoms occur.  Sore Throat A sore throat is pain, burning, irritation, or scratchiness of the throat. There is often pain or tenderness when swallowing or talking. A sore throat may be accompanied by other symptoms, such as coughing, sneezing, fever, and swollen neck glands. A sore throat is often the first sign of another sickness, such as a cold, flu, strep throat, or mononucleosis (commonly known as mono). Most sore throats go away without medical treatment. CAUSES  The most common causes of a sore throat include:  A viral infection, such as a cold, flu, or mono.  A bacterial infection, such as strep throat, tonsillitis, or whooping cough.  Seasonal allergies.  Dryness in the air.  Irritants, such as smoke or pollution.  Gastroesophageal  reflux disease (GERD). HOME CARE INSTRUCTIONS   Only take over-the-counter medicines as directed by your caregiver.  Drink enough fluids to keep your urine clear or pale yellow.  Rest as needed.  Try using throat sprays, lozenges, or sucking on hard candy to ease any pain (if older than 4 years or as directed).  Sip warm liquids, such as broth, herbal tea, or warm water with honey to relieve pain temporarily. You may also eat or drink cold or frozen liquids such as frozen ice pops.  Gargle with salt water (mix 1 tsp salt with 8 oz of water).  Do not smoke and avoid secondhand smoke.  Put a cool-mist humidifier in your bedroom at night to moisten the air. You can also turn on a hot shower and sit in the bathroom with the door closed for 5-10 minutes. SEEK IMMEDIATE MEDICAL CARE IF:  You have difficulty breathing.  You are unable to swallow fluids, soft foods, or your saliva.  You have increased swelling in the throat.  Your sore throat does not get better in 7 days.  You have nausea and vomiting.  You have a fever or persistent symptoms for more than 2-3 days.  You have a fever and your symptoms suddenly get worse. MAKE SURE YOU:   Understand these instructions.  Will watch your condition.  Will get help right away if you are not doing well or get worse.   This information is not intended to replace advice given to you by your health care provider. Make sure you discuss any questions you have with your health care provider.   Document Released: 03/14/2004 Document Revised: 02/25/2014 Document Reviewed: 10/13/2011 Elsevier Interactive Patient Education 2016 Elsevier Inc.  Upper Respiratory Infection, Adult Most upper respiratory infections (URIs) are a viral infection of the air passages leading to the lungs. A URI affects the nose, throat, and upper air passages. The most common type of URI is nasopharyngitis and is typically referred to as "the common cold." URIs run  their course and usually go away on their own. Most of the time, a URI does not require medical attention, but sometimes a bacterial infection in the upper airways can follow a viral infection. This is called a secondary infection. Sinus and  middle ear infections are common types of secondary upper respiratory infections. Bacterial pneumonia can also complicate a URI. A URI can worsen asthma and chronic obstructive pulmonary disease (COPD). Sometimes, these complications can require emergency medical care and may be life threatening.  CAUSES Almost all URIs are caused by viruses. A virus is a type of germ and can spread from one person to another.  RISKS FACTORS You may be at risk for a URI if:   You smoke.   You have chronic heart or lung disease.  You have a weakened defense (immune) system.   You are very young or very old.   You have nasal allergies or asthma.  You work in crowded or poorly ventilated areas.  You work in health care facilities or schools. SIGNS AND SYMPTOMS  Symptoms typically develop 2-3 days after you come in contact with a cold virus. Most viral URIs last 7-10 days. However, viral URIs from the influenza virus (flu virus) can last 14-18 days and are typically more severe. Symptoms may include:   Runny or stuffy (congested) nose.   Sneezing.   Cough.   Sore throat.   Headache.   Fatigue.   Fever.   Loss of appetite.   Pain in your forehead, behind your eyes, and over your cheekbones (sinus pain).  Muscle aches.  DIAGNOSIS  Your health care provider may diagnose a URI by:  Physical exam.  Tests to check that your symptoms are not due to another condition such as:  Strep throat.  Sinusitis.  Pneumonia.  Asthma. TREATMENT  A URI goes away on its own with time. It cannot be cured with medicines, but medicines may be prescribed or recommended to relieve symptoms. Medicines may help:  Reduce your fever.  Reduce your  cough.  Relieve nasal congestion. HOME CARE INSTRUCTIONS   Take medicines only as directed by your health care provider.   Gargle warm saltwater or take cough drops to comfort your throat as directed by your health care provider.  Use a warm mist humidifier or inhale steam from a shower to increase air moisture. This may make it easier to breathe.  Drink enough fluid to keep your urine clear or pale yellow.   Eat soups and other clear broths and maintain good nutrition.   Rest as needed.   Return to work when your temperature has returned to normal or as your health care provider advises. You may need to stay home longer to avoid infecting others. You can also use a face mask and careful hand washing to prevent spread of the virus.  Increase the usage of your inhaler if you have asthma.   Do not use any tobacco products, including cigarettes, chewing tobacco, or electronic cigarettes. If you need help quitting, ask your health care provider. PREVENTION  The best way to protect yourself from getting a cold is to practice good hygiene.   Avoid oral or hand contact with people with cold symptoms.   Wash your hands often if contact occurs.  There is no clear evidence that vitamin C, vitamin E, echinacea, or exercise reduces the chance of developing a cold. However, it is always recommended to get plenty of rest, exercise, and practice good nutrition.  SEEK MEDICAL CARE IF:   You are getting worse rather than better.   Your symptoms are not controlled by medicine.   You have chills.  You have worsening shortness of breath.  You have brown or red mucus.  You have yellow  or brown nasal discharge.  You have pain in your face, especially when you bend forward.  You have a fever.  You have swollen neck glands.  You have pain while swallowing.  You have white areas in the back of your throat. SEEK IMMEDIATE MEDICAL CARE IF:   You have severe or  persistent:  Headache.  Ear pain.  Sinus pain.  Chest pain.  You have chronic lung disease and any of the following:  Wheezing.  Prolonged cough.  Coughing up blood.  A change in your usual mucus.  You have a stiff neck.  You have changes in your:  Vision.  Hearing.  Thinking.  Mood. MAKE SURE YOU:   Understand these instructions.  Will watch your condition.  Will get help right away if you are not doing well or get worse.   This information is not intended to replace advice given to you by your health care provider. Make sure you discuss any questions you have with your health care provider.   Document Released: 07/31/2000 Document Revised: 06/21/2014 Document Reviewed: 05/12/2013 Elsevier Interactive Patient Education [employer].      I personally performed the services described in this documentation, which was scribed in my presence. The recorded information has been reviewed and considered, and addended by me as needed.

## 2015-05-02 NOTE — Patient Instructions (Signed)
IF you received an x-ray today, you will receive an invoice from [facility]. Please contact [facility] at [phone] with questions or concerns regarding your invoice.   IF you received labwork today, you will receive an invoice from [employer]. Please contact [facility] at [phone] with questions or concerns regarding your invoice.   Our billing staff will not be able to assist you with questions regarding bills from these companies.  You will be contacted with the lab results as soon as they are available. The fastest way to get your results is to activate your My Chart account. Instructions are located on the last page of this paperwork. If you have not heard from [place] regarding the results in 2 weeks, please contact this office.   Your flu and strep tests were both normal, however these are rarely falsely negative. Your current symptoms appear to be due to another virus, but could take Tamiflu for possible false-negative flu test. This is up to you and can be decided once get to the pharmacy. For sore throat, use Cepacol or other cough drops, tea, honey, ibuprofen as discussed. Also wrote for Magic mouthwash if you need it.  Return to the clinic or go to the nearest emergency room if any of your symptoms worsen or new symptoms occur.  Sore Throat A sore throat is pain, burning, irritation, or scratchiness of the throat. There is often pain or tenderness when swallowing or talking. A sore throat may be accompanied by other symptoms, such as coughing, sneezing, fever, and swollen neck glands. A sore throat is often the first sign of another sickness, such as a cold, flu, strep throat, or mononucleosis (commonly known as mono). Most sore throats go away without medical treatment. CAUSES  The most common causes of a sore throat include:  A viral infection, such as a cold, flu, or mono.  A bacterial infection, such as strep throat, tonsillitis, or  whooping cough.  Seasonal allergies.  Dryness in the air.  Irritants, such as smoke or pollution.  Gastroesophageal reflux disease (GERD). HOME CARE INSTRUCTIONS   Only take over-the-counter medicines as directed by your caregiver.  Drink enough fluids to keep your urine clear or pale yellow.  Rest as needed.  Try using throat sprays, lozenges, or sucking on hard candy to ease any pain (if older than 4 years or as directed).  Sip warm liquids, such as broth, herbal tea, or warm water with honey to relieve pain temporarily. You may also eat or drink cold or frozen liquids such as frozen ice pops.  Gargle with salt water (mix 1 tsp salt with 8 oz of water).  Do not smoke and avoid secondhand smoke.  Put a cool-mist humidifier in your bedroom at night to moisten the air. You can also turn on a hot shower and sit in the bathroom with the door closed for 5-10 minutes. SEEK IMMEDIATE MEDICAL CARE IF:  You have difficulty breathing.  You are unable to swallow fluids, soft foods, or your saliva.  You have increased swelling in the throat.  Your sore throat does not get better in 7 days.  You have nausea and vomiting.  You have a fever or persistent symptoms for more than 2-3 days.  You have a fever and your symptoms suddenly get worse. MAKE SURE YOU:   Understand these instructions.  Will watch your condition.  Will get help right away if you are not doing well or get worse.   This information  is not intended to replace advice given to you by your health care provider. Make sure you discuss any questions you have with your health care provider.   Document Released: 03/14/2004 Document Revised: 02/25/2014 Document Reviewed: 10/13/2011 Elsevier Interactive Patient Education 2016 Elsevier Inc.  Upper Respiratory Infection, Adult Most upper respiratory infections (URIs) are a viral infection of the air passages leading to the lungs. A URI affects the nose, throat, and  upper air passages. The most common type of URI is nasopharyngitis and is typically referred to as "the common cold." URIs run their course and usually go away on their own. Most of the time, a URI does not require medical attention, but sometimes a bacterial infection in the upper airways can follow a viral infection. This is called a secondary infection. Sinus and middle ear infections are common types of secondary upper respiratory infections. Bacterial pneumonia can also complicate a URI. A URI can worsen asthma and chronic obstructive pulmonary disease (COPD). Sometimes, these complications can require emergency medical care and may be life threatening.  CAUSES Almost all URIs are caused by viruses. A virus is a type of germ and can spread from one person to another.  RISKS FACTORS You may be at risk for a URI if:   You smoke.   You have chronic heart or lung disease.  You have a weakened defense (immune) system.   You are very young or very old.   You have nasal allergies or asthma.  You work in crowded or poorly ventilated areas.  You work in health care facilities or schools. SIGNS AND SYMPTOMS  Symptoms typically develop 2-3 days after you come in contact with a cold virus. Most viral URIs last 7-10 days. However, viral URIs from the influenza virus (flu virus) can last 14-18 days and are typically more severe. Symptoms may include:   Runny or stuffy (congested) nose.   Sneezing.   Cough.   Sore throat.   Headache.   Fatigue.   Fever.   Loss of appetite.   Pain in your forehead, behind your eyes, and over your cheekbones (sinus pain).  Muscle aches.  DIAGNOSIS  Your health care provider may diagnose a URI by:  Physical exam.  Tests to check that your symptoms are not due to another condition such as:  Strep throat.  Sinusitis.  Pneumonia.  Asthma. TREATMENT  A URI goes away on its own with time. It cannot be cured with medicines, but  medicines may be prescribed or recommended to relieve symptoms. Medicines may help:  Reduce your fever.  Reduce your cough.  Relieve nasal congestion. HOME CARE INSTRUCTIONS   Take medicines only as directed by your health care provider.   Gargle warm saltwater or take cough drops to comfort your throat as directed by your health care provider.  Use a warm mist humidifier or inhale steam from a shower to increase air moisture. This may make it easier to breathe.  Drink enough fluid to keep your urine clear or pale yellow.   Eat soups and other clear broths and maintain good nutrition.   Rest as needed.   Return to work when your temperature has returned to normal or as your health care provider advises. You may need to stay home longer to avoid infecting others. You can also use a face mask and careful hand washing to prevent spread of the virus.  Increase the usage of your inhaler if you have asthma.   Do not use any  tobacco products, including cigarettes, chewing tobacco, or electronic cigarettes. If you need help quitting, ask your health care provider. PREVENTION  The best way to protect yourself from getting a cold is to practice good hygiene.   Avoid oral or hand contact with people with cold symptoms.   Wash your hands often if contact occurs.  There is no clear evidence that vitamin C, vitamin E, echinacea, or exercise reduces the chance of developing a cold. However, it is always recommended to get plenty of rest, exercise, and practice good nutrition.  SEEK MEDICAL CARE IF:   You are getting worse rather than better.   Your symptoms are not controlled by medicine.   You have chills.  You have worsening shortness of breath.  You have brown or red mucus.  You have yellow or brown nasal discharge.  You have pain in your face, especially when you bend forward.  You have a fever.  You have swollen neck glands.  You have pain while swallowing.  You  have white areas in the back of your throat. SEEK IMMEDIATE MEDICAL CARE IF:   You have severe or persistent:  Headache.  Ear pain.  Sinus pain.  Chest pain.  You have chronic lung disease and any of the following:  Wheezing.  Prolonged cough.  Coughing up blood.  A change in your usual mucus.  You have a stiff neck.  You have changes in your:  Vision.  Hearing.  Thinking.  Mood. MAKE SURE YOU:   Understand these instructions.  Will watch your condition.  Will get help right away if you are not doing well or get worse.   This information is not intended to replace advice given to you by your health care provider. Make sure you discuss any questions you have with your health care provider.   Document Released: 07/31/2000 Document Revised: 06/21/2014 Document Reviewed: 05/12/2013 Elsevier Interactive Patient Education [employer] Elsevier Inc.

## 2015-05-09 ENCOUNTER — Ambulatory Visit (INDEPENDENT_AMBULATORY_CARE_PROVIDER_SITE_OTHER): Payer: Self-pay

## 2015-05-09 ENCOUNTER — Ambulatory Visit (INDEPENDENT_AMBULATORY_CARE_PROVIDER_SITE_OTHER): Payer: Self-pay | Admitting: Family Medicine

## 2015-05-09 ENCOUNTER — Telehealth: Payer: Self-pay

## 2015-05-09 VITALS — BP 108/76 | HR 111 | Temp 101.7°F | Resp 18 | Ht 67.0 in | Wt 155.0 lb

## 2015-05-09 DIAGNOSIS — R05 Cough: Secondary | ICD-10-CM

## 2015-05-09 DIAGNOSIS — R059 Cough, unspecified: Secondary | ICD-10-CM

## 2015-05-09 DIAGNOSIS — R509 Fever, unspecified: Secondary | ICD-10-CM

## 2015-05-09 LAB — POCT CBC
Granulocyte percent: 67 %G (ref 37–80)
HCT, POC: 37.2 % — AB (ref 37.7–47.9)
Hemoglobin: 13 g/dL (ref 12.2–16.2)
Lymph, poc: 0.6 (ref 0.6–3.4)
MCH, POC: 30.8 pg (ref 27–31.2)
MCHC: 35.1 g/dL (ref 31.8–35.4)
MCV: 87.9 fL (ref 80–97)
MID (cbc): 0.2 (ref 0–0.9)
MPV: 8.1 fL (ref 0–99.8)
POC Granulocyte: 1.7 — AB (ref 2–6.9)
POC LYMPH PERCENT: 25.6 %L (ref 10–50)
POC MID %: 7.4 %M (ref 0–12)
Platelet Count, POC: 153 10*3/uL (ref 142–424)
RBC: 4.23 M/uL (ref 4.04–5.48)
RDW, POC: 12.2 %
WBC: 2.5 10*3/uL — AB (ref 4.6–10.2)

## 2015-05-09 MED ORDER — AZITHROMYCIN 250 MG PO TABS: ORAL_TABLET | ORAL | Status: DC

## 2015-05-09 MED ORDER — HYDROCODONE-ACETAMINOPHEN 7.5-325 MG/15ML PO SOLN: 10.0000 mL | Freq: Four times a day (QID) | ORAL | Status: DC | PRN

## 2015-05-09 NOTE — Telephone Encounter (Signed)
Pt states she was prescribed an antibiotic in pill form that she cannot swallow// she is asking that a different form be called in // pt was just seen 05/09/15 @2pm //   [phone] please call when complete  azithromycin (ZITHROMAX) 250 MG tablet [[id number]] /// Pharmacy:  [id number] - [place], [place] - [address]  Called pt and left msg asking for call back

## 2015-05-09 NOTE — Progress Notes (Addendum)
By signing my name below, I, Stann Oresung-Kai Tsai, attest that this documentation has been prepared under the direction and in the presence of Elvina SidleKurt Ayden Apodaca, MD. Electronically Signed: Stann Oresung-Kai Tsai, Scribe. 05/09/2015 , 1:17 PM .  Patient was seen in room 11 .   Patient ID: Carrie CorningHeather Boothe MRN: [id number], DOB: 09/02/1985, 30 y.o. Date of Encounter: 05/09/2015  Primary Physician: No PCP Per Patient  Chief Complaint:  Chief Complaint  Patient presents with  . Follow-up    got better, symptoms started back yesterday, cough, sob, nasal congestion    HPI:  Carrie Richardson is a 30 y.o. female who presents to [facility] for follow up.  She was seen a week ago by Dr. Neva SeatGreene for exposure to flu. At that visit, she stated that it hurts to swallow starting a day prior. She had strep and flu tests done but they came back negative. She started taking tylenol cold medication. Her throat stopped hurting 5 days ago. She mentions feeling much better 3-4 days ago.   Yesterday, her symptoms worsened during her job interview. She was continuously coughing until she gagged and vomited. Now her head feels congested as well.   Past Medical History  Diagnosis Date  . Abnormal Pap smear   . Allergy      Home Meds: Prior to Admission medications   Medication Sig Start Date End Date Taking? Authorizing Provider  levonorgestrel (MIRENA) 20 MCG/24HR IUD 1 each by Intrauterine route once.     Yes Historical Provider, MD  magic mouthwash w/lidocaine SOLN Take 5 mLs by mouth 4 (four) times daily as needed for mouth pain. Patient not taking: Reported on 05/09/2015 05/02/15   Shade FloodJeffrey R Greene, MD  omeprazole (PRILOSEC) 20 MG capsule Take 20 mg by mouth daily. Reported on 05/09/2015    Historical Provider, MD  oseltamivir (TAMIFLU) 75 MG capsule Take 1 capsule (75 mg total) by mouth 2 (two) times daily. Patient not taking: Reported on 05/09/2015 05/02/15   Shade FloodJeffrey R Greene, MD    Allergies:    Allergies  Allergen Reactions  . Cefdinir     nausea    Social History   Social History  . Marital Status: Married    Spouse Name: N/A  . Number of Children: N/A  . Years of Education: N/A   Occupational History  . Not on file.   Social History Main Topics  . Smoking status: Never Smoker   . Smokeless tobacco: Never Used  . Alcohol Use: No  . Drug Use: No  . Sexual Activity: Yes    Birth Control/ Protection: IUD   Other Topics Concern  . Not on file   Social History Narrative     Review of Systems: Constitutional: negative for fever, chills, night sweats, weight changes; positive for fatigue HEENT: negative for vision changes, hearing loss, rhinorrhea, ST, epistaxis, or sinus pressure; positive for congestion Cardiovascular: negative for chest pain or palpitations Respiratory: negative for hemoptysis, wheezing; positive for shortness of breath, cough Abdominal: negative for abdominal pain, nausea, vomiting, diarrhea, or constipation Dermatological: negative for rash Neurologic: negative for headache, dizziness, or syncope All other systems reviewed and are otherwise negative with the exception to those above and in the HPI.  Physical Exam: Blood pressure 108/76, pulse 111, temperature 101.7 F (38.7 C), temperature source Oral, resp. rate 18, height 5\' 7"  (1.702 m), weight 155 lb (70.308 kg), SpO2 97 %., Body mass index is 24.27 kg/(m^2). General: Well developed, well nourished, in no  acute distress. Head: Normocephalic, atraumatic, eyes without discharge, sclera non-icteric, nares are without discharge. Bilateral auditory canals clear, TM's are without perforation, pearly grey and translucent with reflective cone of light bilaterally. Oral cavity moist, posterior pharynx without exudate, erythema, peritonsillar abscess, or post nasal drip.  Neck: Supple. No thyromegaly. Full ROM. No lymphadenopathy. Lungs: Clear bilaterally to auscultation without wheezes, rales, or  rhonchi. Breathing is unlabored. Heart: RRR with S1 S2. No murmurs, rubs, or gallops appreciated. Abdomen: Soft, non-tender, non-distended with normoactive bowel sounds. No hepatomegaly. No rebound/guarding. No obvious abdominal masses. Msk:  Strength and tone normal for age. Extremities/Skin: Warm and dry. No clubbing or cyanosis. No edema. No rashes or suspicious lesions. Neuro: Alert and oriented X 3. Moves all extremities spontaneously. Gait is normal. CNII-XII grossly in tact. Psych:  Responds to questions appropriately with a normal affect.   Labs: Results for orders placed or performed in visit on 05/02/15  POCT Influenza A/B  Result Value Ref Range   Influenza A, POC Negative Negative   Influenza B, POC Negative Negative  POCT rapid strep A  Result Value Ref Range   Rapid Strep A Screen Negative Negative   Chest x-ray today shows no acute infiltrates. Heart shadow is normal and there is no abnormality of the rib cage.   ASSESSMENT AND PLAN:  30 y.o. year old female with what appears to be a flu syndrome. At this point I think it is reasonable to cover for secondary infection. Cough - Plan: DG Chest 2 View, POCT CBC, azithromycin (ZITHROMAX) 250 MG tablet, HYDROcodone-acetaminophen (HYCET) 7.5-325 mg/15 ml solution  Fever, unspecified fever cause - Plan: DG Chest 2 View, POCT CBC  This chart was scribed in my presence and reviewed by me personally.   Signed, Elvina Sidle, MD 05/09/2015 1:17 PM

## 2015-05-09 NOTE — Telephone Encounter (Signed)
Called pharmacist and changed to liquid suspension form, for 500 mg 1st day , then 250 mg dose days 2-5. QS. Pt aware that I was calling to change it.

## 2015-05-09 NOTE — Patient Instructions (Signed)
It appears that you're having a secondary infection following the flu. For this reason I am prescribing an antibiotic and cough medicine.  Please return if your symptoms are not improved in 48 hours

## 2017-01-13 IMAGING — CR DG CHEST 2V
2 series · 2 of 2 positions shown · non-contrast
Comparison: None in PACs

CLINICAL DATA: Cough and head congestion which began yesterday.

EXAM:
CHEST  2 VIEW

[PA]
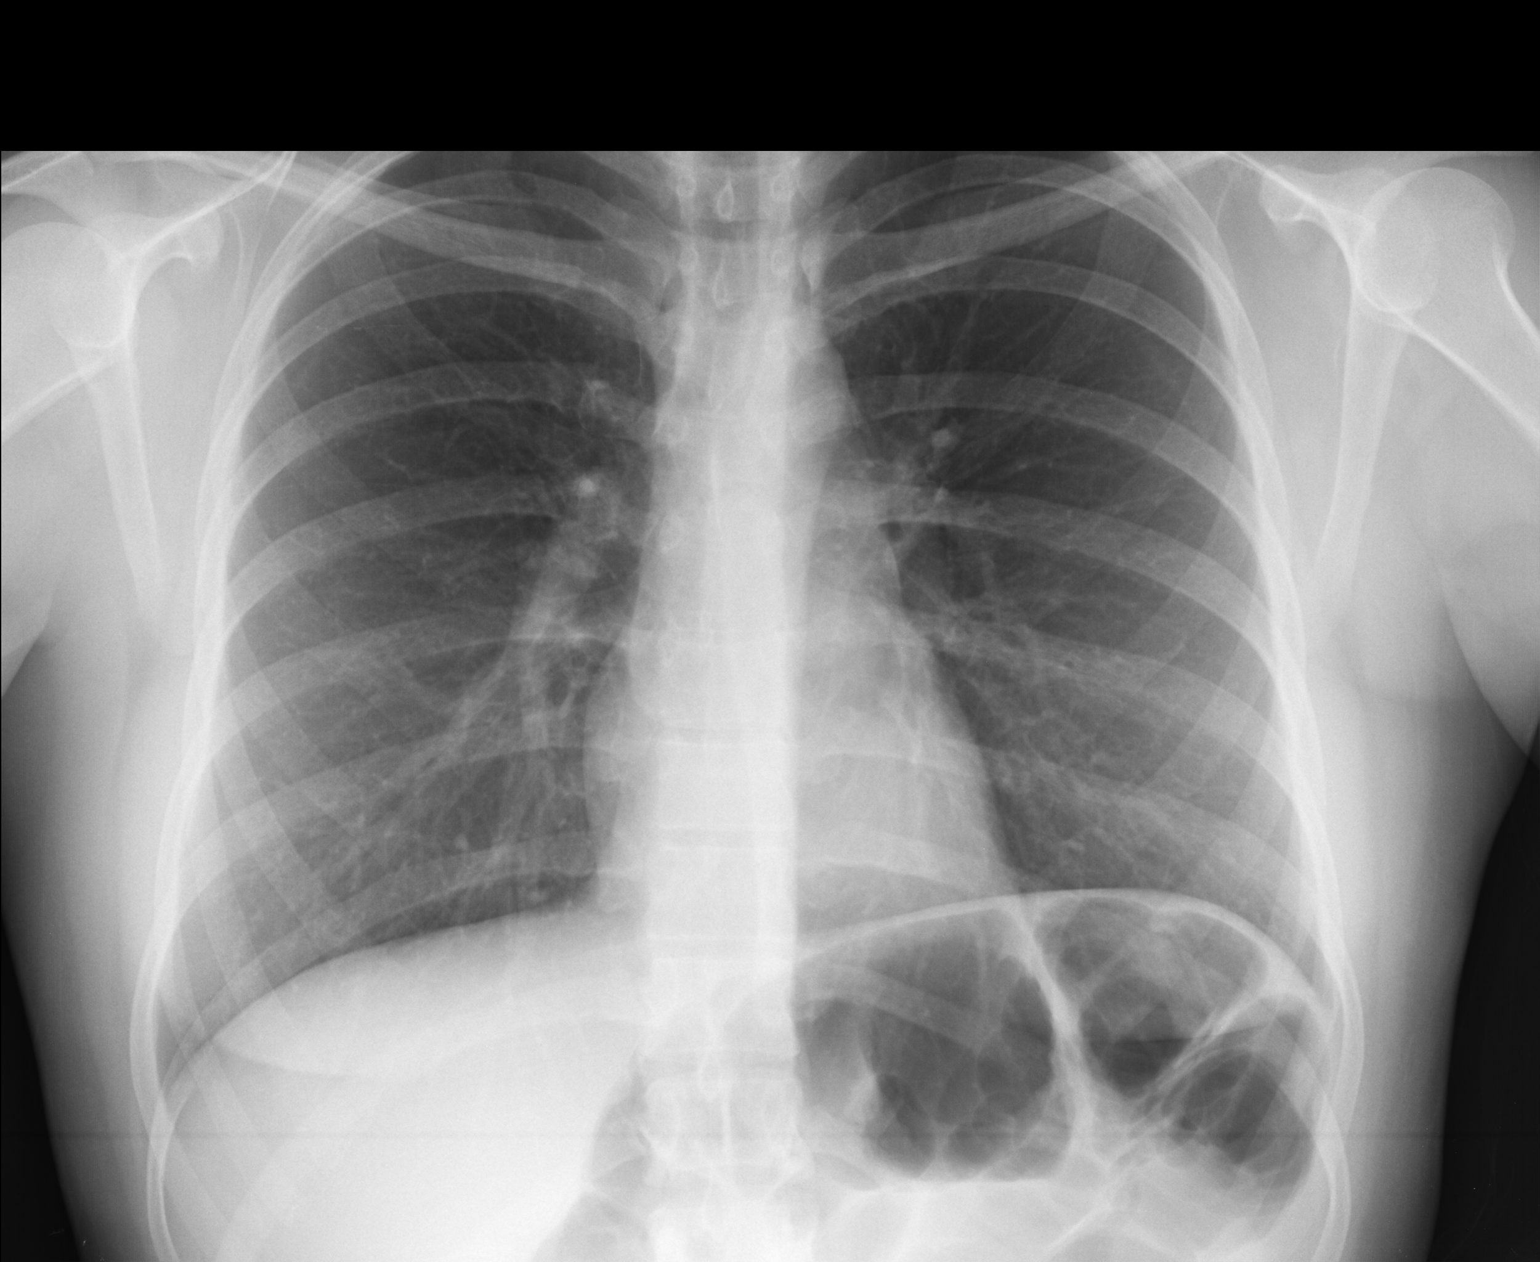

[lateral]
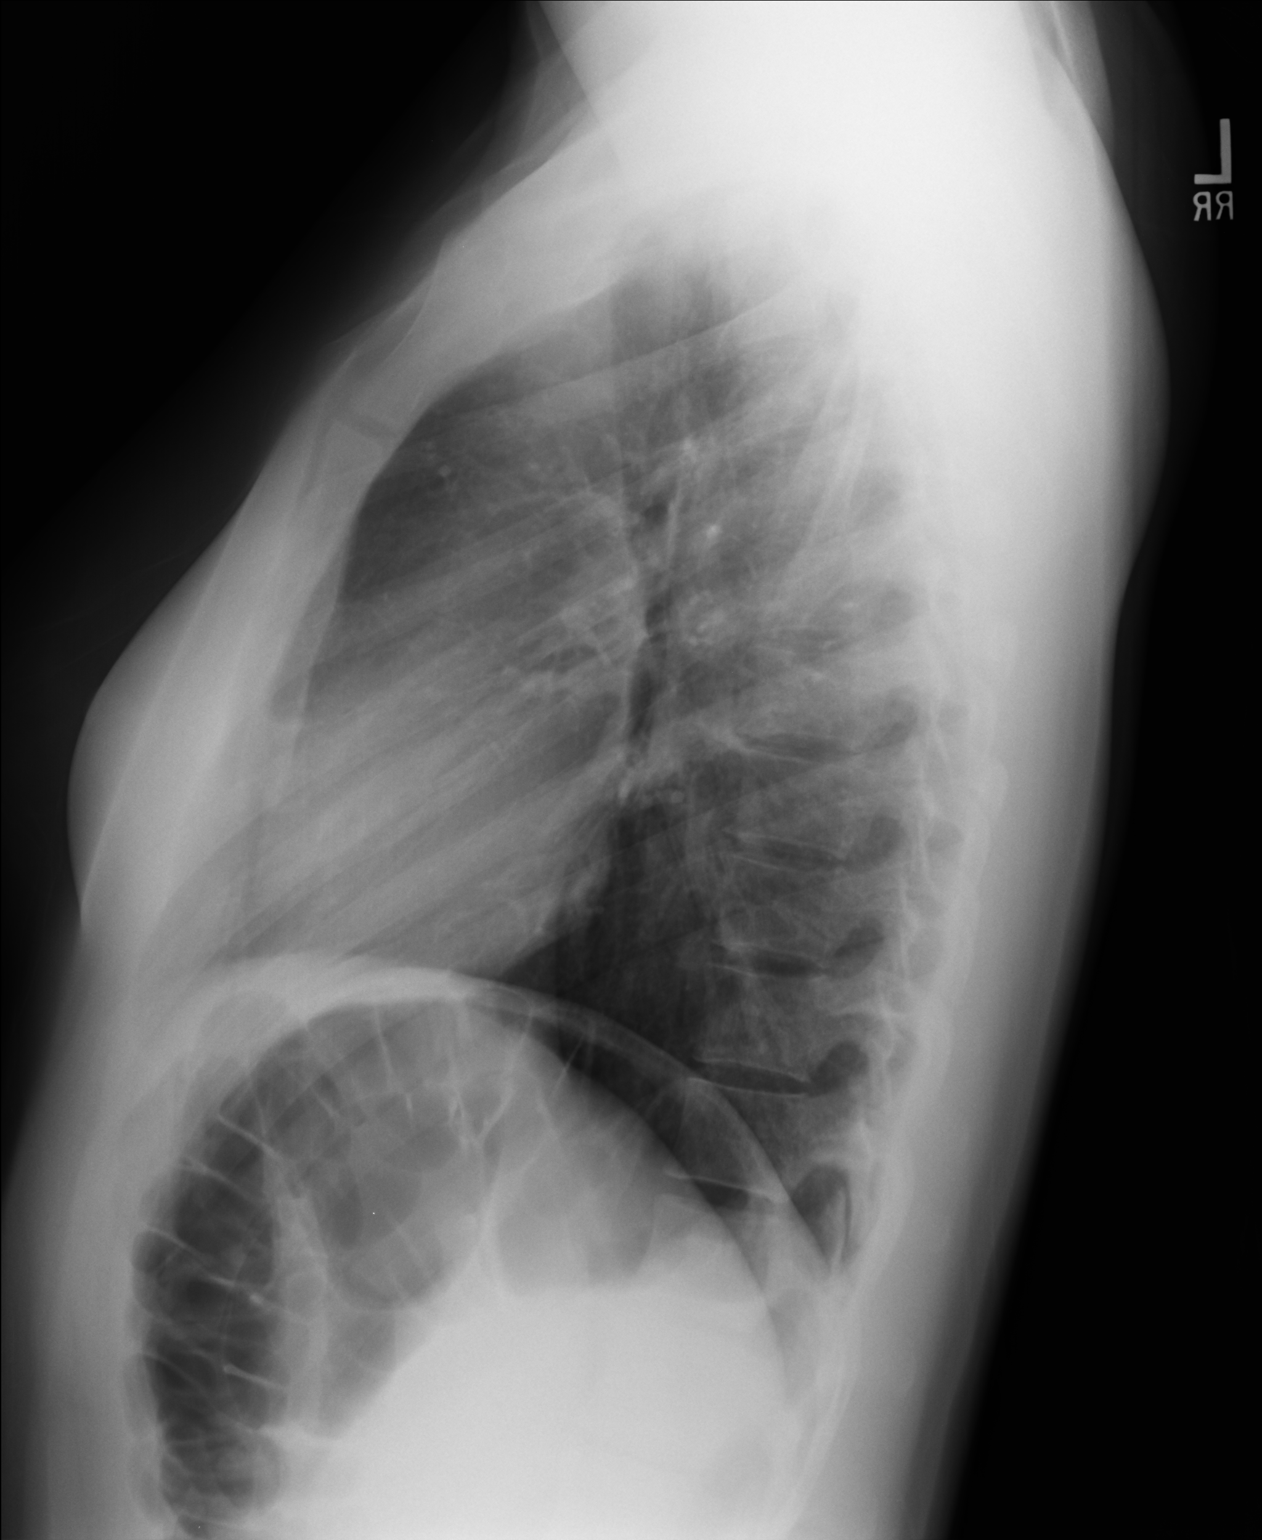

[2 of 2 positions shown; findings below may reference images not displayed]

FINDINGS: The lungs are adequately inflated and clear. The heart and pulmonary
vascularity are normal. The mediastinum is normal in width. There is
no pleural effusion. The bony thorax is unremarkable.
IMPRESSION: There is no active cardiopulmonary disease.

## 2018-02-09 ENCOUNTER — Ambulatory Visit: Payer: Self-pay | Admitting: Internal Medicine

## 2018-02-09 DIAGNOSIS — Z0289 Encounter for other administrative examinations: Secondary | ICD-10-CM

## 2018-02-25 ENCOUNTER — Other Ambulatory Visit: Payer: Self-pay

## 2018-02-25 ENCOUNTER — Ambulatory Visit (HOSPITAL_COMMUNITY)
Admission: EM | Admit: 2018-02-25 | Discharge: 2018-02-25 | Disposition: A | Discharge status: Home / Self Care | Payer: Self-pay | Attending: Family Medicine | Admitting: Family Medicine

## 2018-02-25 ENCOUNTER — Encounter (HOSPITAL_COMMUNITY): Payer: Self-pay

## 2018-02-25 DIAGNOSIS — J028 Acute pharyngitis due to other specified organisms: Secondary | ICD-10-CM | POA: Insufficient documentation

## 2018-02-25 DIAGNOSIS — R05 Cough: Secondary | ICD-10-CM | POA: Insufficient documentation

## 2018-02-25 DIAGNOSIS — B9789 Other viral agents as the cause of diseases classified elsewhere: Secondary | ICD-10-CM | POA: Insufficient documentation

## 2018-02-25 DIAGNOSIS — J029 Acute pharyngitis, unspecified: Secondary | ICD-10-CM

## 2018-02-25 DIAGNOSIS — R059 Cough, unspecified: Secondary | ICD-10-CM

## 2018-02-25 MED ORDER — CETIRIZINE HCL 10 MG PO CHEW: 10.0000 mg | CHEWABLE_TABLET | Freq: Every day | ORAL | 0 refills | Status: DC

## 2018-02-25 NOTE — Discharge Instructions (Signed)
Get plenty of rest and push fluids Zyrtec prescribed for nasal congestion, runny nose, and/or sore throat Use flonase daily Use OTC medications like ibuprofen or tylenol as needed fever or pain Follow up with Carrie Richardson to establish care and/or if symptoms perisists Return or go to ER if you have any new or worsening symptoms fever, chills, nausea, vomiting, chest pain, cough, shortness of breath, wheezing, abdominal pain, changes in bowel or bladder habits, etc..Marland Kitchen

## 2018-02-25 NOTE — ED Triage Notes (Signed)
Pt sore throat dull ache and cough this started yesterday. Pt hard to get the thick mucus up.

## 2018-02-25 NOTE — ED Provider Notes (Signed)
[facility]   [id number] 02/25/18 Arrival Time: 0849   CC: Sore throat  SUBJECTIVE: History from: patient.  Estanislado SpireHeather R Breiner is a 33 y.o. female who presents with abrupt onset of sore throat, fatigue, and low grade fever x 1-2 days.  Patient also mentions nagging cough x few months.  Denies sick exposure or precipitating event.  Has tried OTC cold medications without relief.  Symptoms are made worse with swallowing.  Denies previous symptoms in the past. Complains of subjective fever, and fatigue.  Denies chills, sinus pain, rhinorrhea, SOB, wheezing, chest pain, nausea, changes in bowel or bladder habits.    ROS: As per HPI.  Past Medical History:  Diagnosis Date  . Abnormal Pap smear   . Allergy    History reviewed. No pertinent surgical history. Allergies  Allergen Reactions  . Cefdinir     nausea   No current facility-administered medications on file prior to encounter.    Current Outpatient Medications on File Prior to Encounter  Medication Sig Dispense Refill  . levonorgestrel (MIRENA) 20 MCG/24HR IUD 1 each by Intrauterine route once.      Marland Kitchen. omeprazole (PRILOSEC) 20 MG capsule Take 20 mg by mouth daily. Reported on 05/09/2015     Social History   Socioeconomic History  . Marital status: Married    Spouse name: Not on file  . Number of children: Not on file  . Years of education: Not on file  . Highest education level: Not on file  Occupational History  . Not on file  Social Needs  . Financial resource strain: Not on file  . Food insecurity:    Worry: Not on file    Inability: Not on file  . Transportation needs:    Medical: Not on file    Non-medical: Not on file  Tobacco Use  . Smoking status: Never Smoker  . Smokeless tobacco: Never Used  Substance and Sexual Activity  . Alcohol use: No  . Drug use: No  . Sexual activity: Yes    Birth control/protection: I.U.D.  Lifestyle  . Physical activity:    Days per week: Not on file    Minutes  per session: Not on file  . Stress: Not on file  Relationships  . Social connections:    Talks on phone: Not on file    Gets together: Not on file    Attends religious service: Not on file    Active member of club or organization: Not on file    Attends meetings of clubs or organizations: Not on file    Relationship status: Not on file  . Intimate partner violence:    Fear of current or ex partner: Not on file    Emotionally abused: Not on file    Physically abused: Not on file    Forced sexual activity: Not on file  Other Topics Concern  . Not on file  Social History Narrative  . Not on file   Family History  Problem Relation Age of Onset  . Diabetes Maternal Grandmother   . Cancer Maternal Grandmother        liver cancer  . Diabetes Father   . Hyperlipidemia Mother     OBJECTIVE:  Vitals:   02/25/18 0936 02/25/18 0939  BP: 131/71   Pulse: 81   Resp: 16   Temp: 98.6 F (37 C)   TempSrc: Oral   SpO2: 100%   Weight:  140 lb (63.5 kg)     General appearance:  alert; well-appearing; speaking in full sentences and tolerating own secretions HEENT: NCAT; Ears: EACs clear, TMs pearly gray; Eyes: PERRL.  EOM grossly intact. Nose: nares patent without rhinorrhea, Throat: oropharynx mildly injected, tonsils non erythematous or enlarged, uvula midline  Neck: supple without LAD Lungs: unlabored respirations, symmetrical air entry; cough: absent; no respiratory distress; CTAB Heart: regular rate and rhythm.  Radial pulses 2+ symmetrical bilaterally Skin: warm and dry Psychological: alert and cooperative; normal mood and affect  ASSESSMENT & PLAN:  1. Acute viral pharyngitis   2. Cough     Meds ordered this encounter  Medications  . cetirizine (ZYRTEC) 10 MG chewable tablet    Sig: Chew 1 tablet (10 mg total) by mouth daily.    Dispense:  20 tablet    Refill:  0    Order Specific Question:   Supervising Provider    Answer:   Eustace MooreELSON, YVONNE SUE [[id number]]    Get  plenty of rest and push fluids Zyrtec prescribed for nasal congestion, runny nose, and/or sore throat Use flonase daily Use OTC medications like ibuprofen or tylenol as needed fever or pain Follow up with Joaquin CourtsKimberly Harris to establish care and/or if symptoms perisists Return or go to ER if you have any new or worsening symptoms fever, chills, nausea, vomiting, chest pain, cough, shortness of breath, wheezing, abdominal pain, changes in bowel or bladder habits, etc...  Reviewed expectations re: course of current medical issues. Questions answered. Outlined signs and symptoms indicating need for more acute intervention. Patient verbalized understanding. After Visit Summary given.         Rennis HardingWurst, Adilene Areola, PA-C 02/25/18 1015

## 2018-02-26 ENCOUNTER — Telehealth: Payer: Self-pay

## 2018-02-26 ENCOUNTER — Encounter: Payer: Self-pay | Admitting: Family Medicine

## 2018-02-26 ENCOUNTER — Ambulatory Visit (INDEPENDENT_AMBULATORY_CARE_PROVIDER_SITE_OTHER): Payer: Self-pay | Admitting: Family Medicine

## 2018-02-26 ENCOUNTER — Ambulatory Visit (HOSPITAL_BASED_OUTPATIENT_CLINIC_OR_DEPARTMENT_OTHER)
Admission: RE | Admit: 2018-02-26 | Discharge: 2018-02-26 | Disposition: A | Discharge status: Home / Self Care | Payer: Self-pay | Source: Ambulatory Visit | Attending: Family Medicine | Admitting: Family Medicine

## 2018-02-26 ENCOUNTER — Other Ambulatory Visit: Payer: Self-pay | Admitting: Family Medicine

## 2018-02-26 VITALS — BP 100/68 | HR 96 | Temp 98.9°F | Ht 67.0 in | Wt 143.4 lb

## 2018-02-26 DIAGNOSIS — R05 Cough: Secondary | ICD-10-CM

## 2018-02-26 DIAGNOSIS — J029 Acute pharyngitis, unspecified: Secondary | ICD-10-CM

## 2018-02-26 DIAGNOSIS — R059 Cough, unspecified: Secondary | ICD-10-CM

## 2018-02-26 LAB — POCT RAPID STREP A (OFFICE): RAPID STREP A SCREEN: NEGATIVE

## 2018-02-26 MED ORDER — MONTELUKAST SODIUM 5 MG PO CHEW: 10.0000 mg | CHEWABLE_TABLET | Freq: Every day | ORAL | 1 refills | Status: DC

## 2018-02-26 MED ORDER — FLUTICASONE PROPIONATE 50 MCG/ACT NA SUSP: 2.0000 | Freq: Every day | NASAL | 2 refills | Status: DC

## 2018-02-26 NOTE — Addendum Note (Signed)
Addended by: Scharlene Gloss B on: 02/26/2018 10:47 AM   Modules accepted: Orders

## 2018-02-26 NOTE — Telephone Encounter (Signed)
Chewable called in and Flonase also sent in. Liquid version of the chewable doesn't exist to my knowledge. TY.

## 2018-02-26 NOTE — Progress Notes (Signed)
Sent in. Montelukast does not come in liquid, but we sent in chewable. Flonase also sent in.

## 2018-02-26 NOTE — Progress Notes (Signed)
Pre visit review using our clinic review tool, if applicable. No additional management support is needed unless otherwise documented below in the visit note. 

## 2018-02-26 NOTE — Patient Instructions (Addendum)
Your Rapid Strep test is negative (normal).   We will be in touch regarding your X-ray results.   Stay on Flonase. Consider going back on Prilosec.   Let us know if you need anything.

## 2018-02-26 NOTE — Telephone Encounter (Signed)
Copied from CRM [id number]. Topic: General - Other >> Feb 26, 2018  1:42 PM Jilda Roche wrote: Reason for CRM:  Patient needed to let Dr. Carmelia Roller know her pharmacy to call in prescriptions from today's visit and it is [facility] [address], [place] - [address] [phone] (Phone) [phone] (Fax)  She also needs liquid form as she cannot swallow pills, he advised her to continue her Flonase but she is out of would he call her in a prescription for that as well?   Please advise,  Best call back is [phone]

## 2018-02-26 NOTE — Progress Notes (Signed)
Chief Complaint  Patient presents with  . Cough  . Sore Throat  . Fever    Carrie Richardson here for URI complaints. She is a new pt to the office and has a NP appt w Dr Patsy Lager on 2/20. Here with her mom today.   Duration: 2 days;  Associated symptoms: Fever (99.9 F), sore throat, wheezing and ST Denies: sinus congestion, rhinorrhea, itchy watery eyes, ear pain, ear drainage, shortness of breath and myalgia Treatment to date: Robitussin w honey, Theraflu, Mucinex Sick contacts: No   Early Nov started having a dry cough. She used her daughter's SABA that provided relief for 5 minutes. Saw UC yesterday, rx'd Zyrtec. Has not had a CXR thus far. Will intermittently have wheezing. Denies smoking hx or exposure to 2nd hand smoke. She does have a hx of reflux, used to take PPI, not over past 3-4 years. Also has a hx of allergies, takes INCS during springtime for pollen. She does not find benefit with PO meds like Claritin, Zyrtec, Allegra, or Benadryl.   Past Medical History:  Diagnosis Date  . Abnormal Pap smear   . Allergy     BP 100/68 (BP Location: Left Arm, Patient Position: Sitting, Cuff Size: Normal)   Pulse 96   Temp 98.9 F (37.2 C) (Oral)   Ht 5\' 7"  (1.702 m)   Wt 143 lb 6 oz (65 kg)   SpO2 98%   BMI 22.46 kg/m  General: Awake, alert, appears stated age HEENT: AT, [place], ears patent b/l and TM's neg, nares patent w/o discharge, pharynx mildly erythematous and without exudates, there is post nasal drip visualized, MMM Neck: No masses or asymmetry Heart: RRR, no LE edema Lungs: CTAB, no accessory muscle use Psych: Age appropriate judgment and insight, normal mood and affect  Cough - Plan: DG Chest 2 View  Sore throat  Orders as above.  Rapid strep neg, likely viral. THis could be clouding picture. Cont INCS for now. Consider restarting PPI. Could be a more severe reactive airway/asthma picture where ICS could be beneficial.  Supportive care for ST.  F/u prn or as  originally scheduled with Dr. Patsy Lager.  Pt voiced understanding and agreement to the plan.  Jilda Roche Salvo, DO 02/26/18 10:23 AM

## 2018-02-27 NOTE — Telephone Encounter (Signed)
Patient informed. 

## 2018-03-05 ENCOUNTER — Ambulatory Visit: Payer: Self-pay | Admitting: *Deleted

## 2018-03-05 ENCOUNTER — Telehealth: Payer: Self-pay | Admitting: Family Medicine

## 2018-03-05 NOTE — Telephone Encounter (Signed)
Patient called and says "I was in the office last Thursday and saw Dr. Carmelia Roller. He prescribed me the generic singulair and I was taking it. I'm still coughing, but nothing is coming up. I still feel it in the back of my throat, hanging there. Yesterday, I had 2 nose bleeds lasting about 10 minutes each, then again today at work. For the past three mornings, I have been blowing my nose for about 10 minutes and it's green mucus coming out, then the rest of the day clear. I don't feel my nose is congested, but at night I get real congested. I feel like all of this has turn into a sinus infection and would like to know if Dr. Carmelia Roller can call in an antibiotic, since he's already seen me last week. I don't have the $80 to come back in so soon. I also want to know if not the antibiotic, will mucinex help break up the congestion." I asked about fever, pain, she denies.  I advised I will send this over to Dr. Carmelia Roller for his recommendation and someone will call back.   Pharmacy: [facility] [address], [place] - [address] [phone] (Phone) [phone] (Fax)     Answer Assessment - Initial Assessment Questions 1. LOCATION: "Where does it hurt?"      No pain 2. ONSET: "When did the sinus pain start?"  (e.g., hours, days)      No pain 3. SEVERITY: "How bad is the pain?"   (Scale 1-10; mild, moderate or severe)   - MILD (1-3): doesn't interfere with normal activities    - MODERATE (4-7): interferes with normal activities (e.g., work or school) or awakens from sleep   - SEVERE (8-10): excruciating pain and patient unable to do any normal activities        No pain 4. RECURRENT SYMPTOM: "Have you ever had sinus problems before?" If so, ask: "When was the last time?" and "What happened that time?"      Yes 5. NASAL CONGESTION: "Is the nose blocked?" If so, ask, "Can you open it or must you breathe through the mouth?"     At night I get stopped up, during day it's fine 6.  NASAL DISCHARGE: "Do you have discharge from your nose?" If so ask, "What color?"     Green in the mornings until after 10 minutes blowing, then clear 7. FEVER: "Do you have a fever?" If so, ask: "What is it, how was it measured, and when did it start?"      No 8. OTHER SYMPTOMS: "Do you have any other symptoms?" (e.g., sore throat, cough, earache, difficulty breathing)     Nose bleeds, cough 9. PREGNANCY: "Is there any chance you are pregnant?" "When was your last menstrual period?"     No  Protocols used: SINUS PAIN OR CONGESTION-A-AH

## 2018-03-05 NOTE — Telephone Encounter (Signed)
Copied from CRM [id number]. Topic: Quick Communication - See Telephone Encounter >> Mar 05, 2018  5:52 PM Lorrine Kin, NT wrote: CRM for notification. See Telephone encounter for: 03/05/18.  See nurse triage note from 03/05/2018:  Patient states that she can only take liquid medications. Please advise.

## 2018-03-05 NOTE — Telephone Encounter (Signed)
Message from Areatha Keas sent at 03/05/2018 4:15 PM EST   Summary: advise    Pt was Prescribed a generic version of Singular after an OV for a cough. Pt had three nose bleeds on 1.15.2020 and 1 nose bleed on 1.16.2020. Pt stated when she blows her nose in the morning it is green so she is wondering if she now has a sinus infection. Pt also wants to know can she take any mussinex or anything else while taking the generic Singular. Please advise.          Left message for pt to return call to office to discuss symptoms.

## 2018-03-06 MED ORDER — DOXYCYCLINE HYCLATE 100 MG PO TABS: 100.0000 mg | ORAL_TABLET | Freq: Two times a day (BID) | ORAL | 0 refills | Status: AC

## 2018-03-06 MED ORDER — DOXYCYCLINE CALCIUM 50 MG/5ML PO SYRP
100.0000 mg | ORAL_SOLUTION | Freq: Two times a day (BID) | ORAL | 0 refills | Status: AC
Start: 2018-03-06 — End: 2018-03-13

## 2018-03-06 NOTE — Telephone Encounter (Signed)
Called informed the patient script sent in.

## 2018-03-06 NOTE — Telephone Encounter (Signed)
The patient states she thinks now she has a sinus infection. She thinks she  Is worse now and having nose bleeds due to blowing her nose so much and is green mucous and a dry cough. She cannot afford another office visit and would like something sent in (liquid or chewables)

## 2018-03-06 NOTE — Telephone Encounter (Signed)
She can't take the chewable? Can the pharmacy team help with an alternative? TY.

## 2018-03-06 NOTE — Telephone Encounter (Signed)
I had sent something in for her. TY.

## 2018-03-06 NOTE — Telephone Encounter (Signed)
Will call in to try.

## 2018-03-06 NOTE — Telephone Encounter (Signed)
Sent in liquid and patient aware

## 2018-03-06 NOTE — Addendum Note (Signed)
Addended by: Scharlene Gloss B on: 03/06/2018 11:10 AM   Modules accepted: Orders

## 2018-04-09 ENCOUNTER — Ambulatory Visit: Payer: Self-pay | Admitting: Family Medicine

## 2018-05-08 ENCOUNTER — Encounter (HOSPITAL_COMMUNITY): Payer: Self-pay | Admitting: *Deleted

## 2018-05-11 ENCOUNTER — Telehealth: Payer: Self-pay | Admitting: Obstetrics & Gynecology

## 2018-05-13 ENCOUNTER — Telehealth (HOSPITAL_COMMUNITY): Payer: Self-pay | Admitting: *Deleted

## 2018-05-13 NOTE — Telephone Encounter (Signed)
Telephoned patient at home number and confirmed [facility] appointment for Thursday March 26. Screened patient for COVID-19. No symptoms and no travel.

## 2018-05-14 ENCOUNTER — Other Ambulatory Visit: Payer: Self-pay

## 2018-05-14 ENCOUNTER — Ambulatory Visit (HOSPITAL_COMMUNITY)
Admission: RE | Admit: 2018-05-14 | Discharge: 2018-05-14 | Disposition: A | Discharge status: Home / Self Care | Payer: Self-pay | Source: Ambulatory Visit | Attending: Obstetrics and Gynecology | Admitting: Obstetrics and Gynecology

## 2018-05-14 ENCOUNTER — Encounter (HOSPITAL_COMMUNITY): Payer: Self-pay

## 2018-05-14 VITALS — BP 118/72 | Temp 98.8°F | Wt 145.0 lb

## 2018-05-14 DIAGNOSIS — R87610 Atypical squamous cells of undetermined significance on cytologic smear of cervix (ASC-US): Secondary | ICD-10-CM

## 2018-05-14 DIAGNOSIS — Z1239 Encounter for other screening for malignant neoplasm of breast: Secondary | ICD-10-CM

## 2018-05-14 DIAGNOSIS — R87810 Cervical high risk human papillomavirus (HPV) DNA test positive: Secondary | ICD-10-CM

## 2018-05-14 NOTE — Progress Notes (Signed)
Patient referred to [facility] by the [facility] due to having an abnormal Pap smear on 04/22/2018 that a colposcopy is recommended for follow-up.  Pap Smear: Pap smear not completed today. Last Pap smear was 04/22/2018 at the [facility] and ASCUS with positive HPV. [facility] to the [facility] for a colposcopy to follow-up for her abnormal Pap smear. Appointment scheduled for Wednesday, June 17, 2018 at 0855. Per patient has a history of an abnormal Pap smear when she was 45 or 33 years old that a repeat Pap smear was completed for follow-up. Last Pap smear result is in Epic.  Physical exam: Breasts Breasts symmetrical. No skin abnormalities bilateral breasts. No nipple retraction bilateral breasts. No nipple discharge bilateral breasts. No lymphadenopathy. No lumps palpated bilateral breasts. No complaints of pain or tenderness on exam. Screening mammogram recommended at age 33 unless clinically indicated prior.    Pelvic/Bimanual No Pap smear completed today since last Pap smear was 04/22/2018. Pap smear not indicated per [facility] guidelines.   Smoking History: Patient has never smoked.  Patient Navigation: Patient education provided. Access to services provided for patient through [facility] program.   Breast and Cervical Cancer Risk Assessment: Patient has no family history of breast cancer, known genetic mutations, or radiation treatment to the chest before age 43. Per patient has a history of cervical dysplasia. Patient has no history of being immunocompromised or DES exposure in-utero. Breast cancer risk assessment completed. No breast cancer risk calculated due to patient is less than 61 years old.

## 2018-05-14 NOTE — Patient Instructions (Signed)
Explained breast self awareness with Estanislado Spire. Patient did not need a Pap smear today due to last Pap smear was 04/22/2018. Explained the colposcopy the recommended follow-up for her abnormal Pap smear. Referred [facility] to the [facility] for a colposcopy to follow-up for her abnormal Pap smear. Appointment scheduled for Wednesday, June 17, 2018 at 0855. Patient aware of appointment and will be there. Let patient know a screening mammogram is recommended at age 28 unless clinically indicated prior. Estanislado Spire verbalized understanding.  Jeris Roser, Kathaleen Maser, RN 4:00 PM

## 2018-06-04 ENCOUNTER — Telehealth: Payer: Self-pay | Admitting: Family Medicine

## 2018-06-04 NOTE — Telephone Encounter (Signed)
Pt reports that she got her pap smear done at the [facility] and they scheduled her to have her Mirena replaced d/t it expiring 05/2018.  Pt reports that the health department then called her back and told her that she needed a colposcopy to be done prior to having the Mirena replaced.  Spoke with Dr. Earlene Plater who advised that, following new guidelines, her colposcopy could wait for 6 months to 1 year and that it should not interfere with Mirena placement.  Advised pt of this and she expressed thanks and stated she would call the Health Department and let them know.

## 2018-06-04 NOTE — Telephone Encounter (Signed)
Patient called in stating that she spoke with someone yesterday in regards to her colposcopy being cancelled and she had some concerns with that. Patient stated that the can not get her IUD removed at the [facility] until she has this procedure done. After discussing matters with Morrie Sheldon and Dr. Earlene Plater, the call was transferred to [facility].

## 2018-06-05 ENCOUNTER — Telehealth: Payer: Self-pay | Admitting: *Deleted

## 2018-06-05 NOTE — Telephone Encounter (Signed)
Pt called because she stated that she [facility] and they told her that she did not have to have her IUD removed until 6 years.  Pt concerned that she will experience complications because she was told 5 years.  Verified with Dr. Penne Lash who said that the pt could keep it in as it is a non-emergent procedure and would not experience negative effects from keeping it in a few months longer until the Covid restrictions have passed.  Pt verbalized understanding and expressed relief and thanks.

## 2018-06-17 ENCOUNTER — Ambulatory Visit: Payer: Self-pay | Admitting: Obstetrics & Gynecology

## 2018-06-29 ENCOUNTER — Encounter (HOSPITAL_COMMUNITY): Payer: Self-pay | Admitting: *Deleted

## 2018-09-11 ENCOUNTER — Telehealth (HOSPITAL_COMMUNITY): Payer: Self-pay | Admitting: *Deleted

## 2018-09-11 NOTE — Telephone Encounter (Signed)
Patient left me a voicemail in regards to concerns of her colposcopy being rescheduled. Attempted to call patient back. No one answered phone. Left voicemail for patient to call me back.

## 2018-09-28 ENCOUNTER — Ambulatory Visit: Payer: Self-pay | Admitting: Obstetrics and Gynecology

## 2018-10-14 ENCOUNTER — Encounter: Payer: Self-pay | Admitting: Family Medicine

## 2018-10-14 ENCOUNTER — Other Ambulatory Visit (HOSPITAL_COMMUNITY)
Admission: RE | Admit: 2018-10-14 | Discharge: 2018-10-14 | Disposition: A | Discharge status: Home / Self Care | Payer: Self-pay | Source: Ambulatory Visit | Attending: Family Medicine | Admitting: Family Medicine

## 2018-10-14 ENCOUNTER — Ambulatory Visit (INDEPENDENT_AMBULATORY_CARE_PROVIDER_SITE_OTHER): Payer: Self-pay | Admitting: Family Medicine

## 2018-10-14 ENCOUNTER — Other Ambulatory Visit: Payer: Self-pay

## 2018-10-14 DIAGNOSIS — R87810 Cervical high risk human papillomavirus (HPV) DNA test positive: Secondary | ICD-10-CM | POA: Insufficient documentation

## 2018-10-14 DIAGNOSIS — R87610 Atypical squamous cells of undetermined significance on cytologic smear of cervix (ASC-US): Secondary | ICD-10-CM

## 2018-10-14 DIAGNOSIS — N870 Mild cervical dysplasia: Secondary | ICD-10-CM

## 2018-10-14 NOTE — Progress Notes (Addendum)
    GYNECOLOGY CLINIC COLPOSCOPY PROCEDURE NOTE  33 y.o. G1P1001 here for colposcopy for ASCUS with POSITIVE high risk HPV pap smear on 04/2018. Discussed role for HPV in cervical dysplasia, need for surveillance.  Patient given informed consent, signed copy in the chart, time out was performed.  Placed in lithotomy position. Cervix viewed with speculum and colposcope after application of acetic acid.   Colposcopy adequate? Yes  visible lesion(s) at 9-3 o'clock; corresponding biopsies obtained.  ECC specimen obtained. All specimens were labeled and sent to pathology.  Patient was given post procedure instructions.  Will follow up pathology and manage accordingly; patient will be contacted with results and recommendations.  Routine preventative health maintenance measures emphasized. IUD strings cut during biopsies, IUD in correct place.  Donnamae Jude, MD 10/14/2018 4:29 PM

## 2018-10-14 NOTE — Patient Instructions (Signed)
Colposcopy, Care After This sheet gives you information about how to care for yourself after your procedure. Your doctor may also give you more specific instructions. If you have problems or questions, contact your doctor. What can I expect after the procedure? If you did not have a tissue sample removed (did not have a biopsy), you may only have some spotting for a few days. You can go back to your normal activities. If you had a tissue sample removed, it is common to have:  Soreness and pain. This may last for a few days.  Light-headedness.  Mild bleeding from your vagina or dark-colored, grainy discharge from your vagina. This may last for a few days. You may need to wear a sanitary pad.  Spotting for at least 48 hours after the procedure. Follow these instructions at home:   Take over-the-counter and prescription medicines only as told by your doctor. Ask your doctor what medicines you can start taking again. This is very important if you take blood-thinning medicine.  Do not drive or use heavy machinery while taking prescription pain medicine.  For 3 days, or as long as your doctor tells you, avoid: ? Douching. ? Using tampons. ? Having sex.  If you use birth control (contraception), keep using it.  Limit activity for the first day after the procedure. Ask your doctor what activities are safe for you.  It is up to you to get the results of your procedure. Ask your doctor when your results will be ready.  Keep all follow-up visits as told by your doctor. This is important. Contact a doctor if:  You get a skin rash. Get help right away if:  You are bleeding a lot from your vagina. It is a lot of bleeding if you are using more than one pad an hour for 2 hours in a row.  You have clumps of blood (blood clots) coming from your vagina.  You have a fever.  You have chills  You have pain in your lower belly (pelvic area).  You have signs of infection, such as vaginal  discharge that is: ? Different than usual. ? Yellow. ? Bad-smelling.  You have very pain or cramps in your lower belly that do not get better with medicine.  You feel light-headed.  You feel dizzy.  You pass out (faint). Summary  If you did not have a tissue sample removed (did not have a biopsy), you may only have some spotting for a few days. You can go back to your normal activities.  If you had a tissue sample removed, it is common to have mild pain and spotting for 48 hours.  For 3 days, or as long as your doctor tells you, avoid douching, using tampons and having sex.  Get help right away if you have bleeding, very bad pain, or signs of infection. This information is not intended to replace advice given to you by your health care provider. Make sure you discuss any questions you have with your health care provider. Document Released: 07/24/2007 Document Revised: 01/17/2017 Document Reviewed: 10/25/2015 Elsevier Patient Education  2020 Elsevier Inc.  

## 2018-10-19 ENCOUNTER — Telehealth (INDEPENDENT_AMBULATORY_CARE_PROVIDER_SITE_OTHER): Payer: Self-pay | Admitting: General Practice

## 2018-10-19 DIAGNOSIS — R87810 Cervical high risk human papillomavirus (HPV) DNA test positive: Secondary | ICD-10-CM

## 2018-10-19 DIAGNOSIS — R87610 Atypical squamous cells of undetermined significance on cytologic smear of cervix (ASC-US): Secondary | ICD-10-CM

## 2018-10-19 NOTE — Telephone Encounter (Signed)
Patient called back into front office and states she had additional questions about her results. She states she was more curious about the HPV part and if treatment was needed. Discussed with patient there is no treatment for HPV and the only recommendation is yearly pap smears at this point. Discussed how HPV is contracted, dormancy, and inability to know when she contracted it or from whom. Patient verbalized understanding and she states her husband was recently tested and was negative. She would like to know if they should use condoms. Discussed his test could only detect if he was positive in that moment not if he had it in general. Discussed he very likely already has it. Told patient we have no way of knowing if he had it and gave it to her or vice versa but that condoms weren't necessary in a monogamous relationship. Patient verbalized understanding & had no other questions.

## 2018-10-19 NOTE — Telephone Encounter (Signed)
Patient called and left message on nurse voicemail line stating she got her results in [place] but has question. She is requesting a call back. Called patient, no answer- phone continuously rings with no voicemail option. Will send mychart message.

## 2018-12-01 ENCOUNTER — Encounter: Payer: Self-pay | Admitting: Emergency Medicine

## 2018-12-01 NOTE — Progress Notes (Signed)
This patient came into the office today stating she needed to speak with someone regarding her IUD. Pt stated she recently had a biopsy done and her IUD strings were accidentally cut shorter during the procedure. Pt stated that she went to the health department today for a string check and they were unable to find her strings. Pt denied seeing the IUD come out. Pt reports no bleeding or pain. Pt stated the health department recommended her to come to the office today to discuss.  Per Curt Bears, CNM, it is recommended pt schedule an appointment for a string check. Pt verbalized understanding and had no further questions.

## 2018-12-03 ENCOUNTER — Telehealth (INDEPENDENT_AMBULATORY_CARE_PROVIDER_SITE_OTHER): Payer: Self-pay | Admitting: Lactation Services

## 2018-12-03 ENCOUNTER — Encounter: Payer: Self-pay | Admitting: *Deleted

## 2018-12-03 DIAGNOSIS — R87810 Cervical high risk human papillomavirus (HPV) DNA test positive: Secondary | ICD-10-CM

## 2018-12-03 DIAGNOSIS — R87610 Atypical squamous cells of undetermined significance on cytologic smear of cervix (ASC-US): Secondary | ICD-10-CM

## 2018-12-03 NOTE — Telephone Encounter (Signed)
Called and spoke with pt. She reports the HD placed her IUD and reports the provider could not find with Pap or IUD string hook.   She reports that she was informed by Provider IUD strings were cut during biopsy.  She reports HD wants to do the [place] to determine if Korea is in place. Discussed with her that Dr Kennon Rounds does not feel like an [place] is needed as IUD was in place after biopsy.   Pt reports she has performed a self string check and reports they are unable to feel the string checks. Pt reports she has not found the IUD in the toilet but she does not look in the toilet each time she goes to the bathroom. She reports her husband often can feel her strings and cannot feel them either.    Pt is concerned with cost of Ultrasound. Was told that she will have to pay her copay of $160.   Pt is concerned with taking days off work to have provider look for strings and then wait for [place] to be competed. She is concerned with unwanted pregnancy. She is aware that she may want to wear condoms until she is sure her IUD is in place. She is concerned with the cost of condoms. She reports she has panic attacks and is concerned with unwanted unplanned pregnancy.   Pt has follow up US scheduled for Monday Oct. 19 at the HD.  Pt has appt at [facility] on Monday Oct. 26 for vaginal string check with provider.   Pt was informed that Dr. Kennon Rounds was consulted and verified that IUD was still in place on the day of her biopsy. Provider does not feel like an [place] is warranted.   After speaking with pt it was agreed that she will keep her appt with Dr. Kennon Rounds on 10/26. Depending on the findings with exam, and after speaking with Cathlean Marseilles, Pt was given the option of possibly having the IUD removed that day and replaced the same day. Discussed with pt that IUD removal and replacement would still go through her insurance and there may be a copay. Pt interested in filling out [facility], will be mailed to pt today. Pt does  not wish to pay her copay on the date of service until [place] is decided on.   Pt was offered to come by the office to pick up condoms, she reports she feels like she does not need them at this time.   Pt agreed to above plan and will discuss with Dr. Kennon Rounds on 10/26.

## 2018-12-14 ENCOUNTER — Ambulatory Visit: Payer: Self-pay | Admitting: Family Medicine

## 2018-12-14 ENCOUNTER — Encounter: Payer: Self-pay | Admitting: Family Medicine

## 2018-12-14 ENCOUNTER — Ambulatory Visit (INDEPENDENT_AMBULATORY_CARE_PROVIDER_SITE_OTHER): Payer: Self-pay | Admitting: Family Medicine

## 2018-12-14 ENCOUNTER — Other Ambulatory Visit: Payer: Self-pay

## 2018-12-14 VITALS — BP 113/73 | HR 93 | Wt 142.2 lb

## 2018-12-14 DIAGNOSIS — Z30431 Encounter for routine checking of intrauterine contraceptive device: Secondary | ICD-10-CM

## 2018-12-14 NOTE — Progress Notes (Signed)
   Subjective:    Patient ID: Carrie Richardson is a 33 y.o. female presenting with IUD string check  on 12/14/2018  HPI: Here for IUD check. She was seen in August following placement and had colpo and strings cut inadvertently during procedure. F/u for string check, failed to find the IUD. She is wanting confirmation that her IUD is in place.  Review of Systems  Constitutional: Negative for chills and fever.  Respiratory: Negative for shortness of breath.   Cardiovascular: Negative for chest pain.  Gastrointestinal: Negative for abdominal pain, nausea and vomiting.  Genitourinary: Negative for dysuria.  Skin: Negative for rash.      Objective:    BP 113/73   Pulse 93   Wt 142 lb 3.2 oz (64.5 kg)   BMI 22.27 kg/m  Physical Exam Constitutional:      Appearance: She is well-developed.  HENT:     Head: Normocephalic and atraumatic.  Eyes:     General: No scleral icterus.    Pupils: Pupils are equal, round, and reactive to light.  Neck:     Musculoskeletal: Normal range of motion.     Thyroid: No thyromegaly.  Cardiovascular:     Rate and Rhythm: Normal rate and regular rhythm.  Pulmonary:     Effort: Pulmonary effort is normal.     Breath sounds: Normal breath sounds.  Abdominal:     General: There is no distension.     Palpations: Abdomen is soft.     Tenderness: There is no abdominal tenderness.  Skin:    General: Skin is warm and dry.  Neurological:     Mental Status: She is alert and oriented to person, place, and time.    Limited u/s--IUD in place in uterus     Assessment & Plan:   Problem List Items Addressed This Visit    None    Visit Diagnoses    IUD check up    -  Primary   in place--return for removal and re-insertion in 4.5 years.      Total face-to-face time with patient: 10 minutes. Over 50% of encounter was spent on counseling and coordination of care. Return in about 1 year (around 12/14/2019).  Donnamae Jude 12/14/2018 1:15 PM

## 2020-02-29 ENCOUNTER — Encounter: Payer: Self-pay | Admitting: Medical

## 2020-02-29 ENCOUNTER — Other Ambulatory Visit: Payer: Self-pay

## 2020-02-29 ENCOUNTER — Ambulatory Visit (INDEPENDENT_AMBULATORY_CARE_PROVIDER_SITE_OTHER): Payer: Self-pay | Admitting: Medical

## 2020-02-29 ENCOUNTER — Other Ambulatory Visit (HOSPITAL_COMMUNITY)
Admission: RE | Admit: 2020-02-29 | Discharge: 2020-02-29 | Disposition: A | Discharge status: Home / Self Care | Payer: Self-pay | Source: Ambulatory Visit | Attending: Medical | Admitting: Medical

## 2020-02-29 ENCOUNTER — Ambulatory Visit: Payer: Self-pay | Admitting: *Deleted

## 2020-02-29 VITALS — BP 111/85 | HR 80 | Ht 67.0 in | Wt 160.6 lb

## 2020-02-29 VITALS — BP 108/70 | Wt 161.5 lb

## 2020-02-29 DIAGNOSIS — R87810 Cervical high risk human papillomavirus (HPV) DNA test positive: Secondary | ICD-10-CM

## 2020-02-29 DIAGNOSIS — Z3202 Encounter for pregnancy test, result negative: Secondary | ICD-10-CM

## 2020-02-29 DIAGNOSIS — Z1239 Encounter for other screening for malignant neoplasm of breast: Secondary | ICD-10-CM

## 2020-02-29 DIAGNOSIS — R87610 Atypical squamous cells of undetermined significance on cytologic smear of cervix (ASC-US): Secondary | ICD-10-CM

## 2020-02-29 LAB — POCT PREGNANCY, URINE: Preg Test, Ur: NEGATIVE

## 2020-02-29 NOTE — Patient Instructions (Signed)
Explained breast self awareness with Estanislado Spire. Patient did not need a Pap smear today due to last Pap smear was 09/17/2019. Explained the colposcopy the recommended follow-up for her abnormal Pap smear. Referred patient to [facility] for [facility] for a colposcopy to follow-up for her abnormal Pap smear. Appointment scheduled Tuesday, February 29, 2020 at 0935. Patient aware of appointment and will be there. Let patient know a screening mammogram is recommended at age 70 unless clinically indicated prior. Estanislado Spire verbalized understanding.  Tayana Shankle, Kathaleen Maser, RN 8:55 AM

## 2020-02-29 NOTE — Progress Notes (Signed)
Ms. Carrie Richardson is a 35 y.o. female who presents to [facility] clinic today with no complaints. Patient referred to [facility]CP by the [facility] Department due to having an abnormal Pap smear on 09/17/2019 that a colposcopy is recommended for follow-up.   Pap Smear: Pap smear not completed today. Last Pap smear was 09/17/2019 at the [facility] Department and ASCUS with positive HPV. 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 Per patient has a history of one other abnormal Pap smear when she was 58 or 35 years old that a repeat Pap smear was completed for follow-up. Last two Pap smear results are in Epic.   Physical exam: Breasts Breasts symmetrical. No skin abnormalities bilateral breasts. No nipple retraction bilateral breasts. No nipple discharge bilateral breasts. No lymphadenopathy. No lumps palpated bilateral breasts. No complaints of pain or tenderness on exam. Screening mammogram recommended at age 52 unless clinically indicated prior.       Pelvic/Bimanual Pap is not indicated today per BCCCP guidelines.   Smoking History: Patient has never smoked.   Patient Navigation: Patient education provided. Access to services provided for patient through BCCCP program.    Breast and Cervical Cancer Risk Assessment: Patient does not have family history of breast cancer, known genetic mutations, or radiation treatment to the chest before age 71. Patient has history of cervical dysplasia. Patient has no history of being immunocompromised or DES exposure in-utero. Breast cancer risk assessment completed. No breast cancer risk calculated due to patient is less than 58 years old.  Risk Assessment    Risk Scores      02/29/2020 05/14/2018   Last edited by: Narda Rutherford, LPN Stoney Bang H, LPN   5-year risk:     Lifetime risk:            A: BCCCP exam without pap smear No  complaints.  P: Referred patient to [facility] for [facility] for a colposcopy to follow-up for her abnormal Pap smear. Appointment scheduled Tuesday, February 29, 2020 at 0935.  Priscille Heidelberg, RN 02/29/2020 8:55 AM

## 2020-02-29 NOTE — Progress Notes (Signed)
    GYNECOLOGY CLINIC COLPOSCOPY PROCEDURE NOTE  Ms. Carrie Richardson is a 35 y.o. G1P1001 here for colposcopy for ASCUS with NEGATIVE high risk HPV (HPV noted was not HR) pap smear on 04/22/2018. Discussed role for HPV in cervical dysplasia, need for surveillance.  Patient given informed consent, signed copy in the chart, time out was performed.  Placed in lithotomy position. Cervix viewed with speculum and colposcope after application of acetic acid.   Colposcopy adequate? Yes  no visible lesions and punctation noted at 4 o'clock and 7 o'clock; biopsies obtained.  ECC specimen obtained. All specimens were labelled and sent to pathology.   Patient was given post procedure instructions.  Will follow up pathology and manage accordingly.  Routine preventative health maintenance measures emphasized.   Marny Lowenstein, PA-C 02/29/2020 10:30 AM

## 2020-02-29 NOTE — Patient Instructions (Signed)

## 2020-03-02 LAB — SURGICAL PATHOLOGY

## 2020-03-10 ENCOUNTER — Telehealth: Payer: Self-pay

## 2020-03-10 NOTE — Telephone Encounter (Signed)
Patient called and stated she was received a bill for her colposcopy on 02/29/2020, and was told this service is covered by [facility], and also was told per [facility] that she needs a Gardasil injection. Patient informed she is correct, BCCCP will cover the Colposcopy, but she will be responsible for a small amount of the pregnancy test, and needs to contact [place] to discuss results and recommendations, cost of vaccine. Patient agreed, telephone number provided for [facility].

## 2020-06-22 ENCOUNTER — Ambulatory Visit (HOSPITAL_COMMUNITY)
Admission: RE | Admit: 2020-06-22 | Discharge: 2020-06-22 | Disposition: A | Discharge status: Home / Self Care | Payer: Medicaid Other | Source: Ambulatory Visit | Attending: Emergency Medicine | Admitting: Emergency Medicine

## 2020-06-22 ENCOUNTER — Encounter (HOSPITAL_COMMUNITY): Payer: Self-pay

## 2020-06-22 ENCOUNTER — Other Ambulatory Visit: Payer: Self-pay

## 2020-06-22 ENCOUNTER — Ambulatory Visit (INDEPENDENT_AMBULATORY_CARE_PROVIDER_SITE_OTHER): Payer: Medicaid Other

## 2020-06-22 VITALS — BP 112/74 | HR 85 | Temp 99.0°F | Resp 20

## 2020-06-22 DIAGNOSIS — R059 Cough, unspecified: Secondary | ICD-10-CM

## 2020-06-22 DIAGNOSIS — R0781 Pleurodynia: Secondary | ICD-10-CM

## 2020-06-22 MED ORDER — HYDROCOD POLST-CPM POLST ER 10-8 MG/5ML PO SUER: 5.0000 mL | Freq: Every evening | ORAL | 0 refills | Status: DC | PRN

## 2020-06-22 MED ORDER — PREDNISONE 20 MG PO TABS: 40.0000 mg | ORAL_TABLET | Freq: Every day | ORAL | 0 refills | Status: DC

## 2020-06-22 MED ORDER — BENZONATATE 100 MG PO CAPS: 100.0000 mg | ORAL_CAPSULE | Freq: Three times a day (TID) | ORAL | 0 refills | Status: DC

## 2020-06-22 NOTE — Discharge Instructions (Addendum)
Take 2 prednisone tablets daily for the next 5 days  Continue use of inhaler 2 every 4-6 hours as needed to help with shortness of breath and wheezing  Can use Tessalon pill 3 times a day as needed  Can use 5 mL of Tussionex at bedtime as needed for additional assistance to prevent cough

## 2020-06-22 NOTE — ED Provider Notes (Signed)
[facility]    CSN: [id number] Arrival date & time: 06/22/20  1441      History   Chief Complaint Chief Complaint  Patient presents with  . Cough  . chest congestion  . Appointment    HPI Carrie Richardson is a 35 y.o. female.   Patient presents with nonproductive cough for 1 month.  Cough is causing chest soreness and coughing fits.  Can intermittently hearing wheezing when lying flat and has been having shortness of breath at rest.  Cough is worsened over the last week.  Initially seen around 05/26/2020 at a [facility] where she was told this was related to allergies.  Has been taking daily allergy medicine with no relief.  Initial associated symptom of congestion has resolved.  Has attempted use of Delsym with no relief.  Denies fever, chills, chest pain.  Past Medical History:  Diagnosis Date  . Abnormal Pap smear   . Allergy     Patient Active Problem List   Diagnosis Date Noted  . ASCUS with positive high risk HPV cervical 10/14/2018    History reviewed. No pertinent surgical history.  OB History    Gravida  1   Para  1   Term  1   Preterm  0   AB  0   Living  1     SAB  0   IAB  0   Ectopic  0   Multiple  0   Live Births  1            Home Medications    Prior to Admission medications   Medication Sig Start Date End Date Taking? Authorizing Provider  levonorgestrel (MIRENA) 20 MCG/24HR IUD 1 each by Intrauterine route once.    [provider]    Family History Family History  Problem Relation Age of Onset  . Diabetes Maternal Grandmother   . Cancer Maternal Grandmother        liver cancer  . Diabetes Father   . Hyperlipidemia Mother     Social History Social History   Tobacco Use  . Smoking status: Never Smoker  . Smokeless tobacco: Never Used  Vaping Use  . Vaping Use: Never used  Substance Use Topics  . Alcohol use: No  . Drug use: No     Allergies   Cefdinir   Review of  Systems Review of Systems  Constitutional: Negative.   HENT: Negative.   Respiratory: Positive for cough, shortness of breath and wheezing. Negative for apnea, choking, chest tightness and stridor.   Cardiovascular: Negative.   Skin: Negative.   Neurological: Negative.      Physical Exam Triage Vital Signs ED Triage Vitals  Enc Vitals Group     BP 06/22/20 1502 112/74     Pulse Rate 06/22/20 1502 85     Resp 06/22/20 1502 20     Temp 06/22/20 1502 99 F (37.2 C)     Temp Source 06/22/20 1502 Oral     SpO2 06/22/20 1502 99 %     Weight --      Height --      Head Circumference --      Peak Flow --      Pain Score 06/22/20 1501 3     Pain Loc --      Pain Edu? --      Excl. in GC? --    No data found.  Updated Vital Signs BP 112/74 (BP Location:  Left Arm)   Pulse 85   Temp 99 F (37.2 C) (Oral)   Resp 20   SpO2 99%   Visual Acuity Right Eye Distance:   Left Eye Distance:   Bilateral Distance:    Right Eye Near:   Left Eye Near:    Bilateral Near:     Physical Exam Constitutional:      Appearance: Normal appearance. She is normal weight.  HENT:     Head: Normocephalic.  Eyes:     Extraocular Movements: Extraocular movements intact.  Cardiovascular:     Rate and Rhythm: Normal rate and regular rhythm.     Pulses: Normal pulses.     Heart sounds: Normal heart sounds.  Pulmonary:     Effort: Pulmonary effort is normal.     Breath sounds: Normal breath sounds.  Musculoskeletal:        General: Normal range of motion.     Cervical back: Normal range of motion.  Skin:    General: Skin is warm and dry.  Neurological:     General: No focal deficit present.     Mental Status: She is alert and oriented to person, place, and time. Mental status is at baseline.  Psychiatric:        Mood and Affect: Mood normal.        Behavior: Behavior normal.        Thought Content: Thought content normal.      UC Treatments / Results  Labs (all labs ordered are  listed, but only abnormal results are displayed) Labs Reviewed - No data to display  EKG   Radiology No results found.  Procedures Procedures (including critical care time)  Medications Ordered in UC Medications - No data to display  Initial Impression / Assessment and Plan / UC Course  I have reviewed the triage vital signs and the nursing notes.  Pertinent labs & imaging results that were available during my care of the patient were reviewed by me and considered in my medical decision making (see chart for details).  Cough  1.  Chest x-ray negative 2.  Prednisone 40 mg daily for 5 days, patient has difficulty swallowing pills may need prescription changed to liquid 3.  Tessalon mg 3 times daily as needed 4.  Tussionex 5 mL at bedtime as needed  Final Clinical Impressions(s) / UC Diagnoses   Final diagnoses:  None   Discharge Instructions   None    ED Prescriptions    None     PDMP not reviewed this encounter.   Valinda Hoar, NP 06/22/20 1616

## 2020-06-22 NOTE — ED Triage Notes (Signed)
Pt c/o cough and rib pain when coughing. She states when she lays flat she starts wheezing. Pt states the cough has not gotten better.

## 2021-04-04 ENCOUNTER — Other Ambulatory Visit: Payer: Self-pay | Admitting: *Deleted

## 2021-04-04 ENCOUNTER — Other Ambulatory Visit: Payer: Self-pay

## 2021-04-04 DIAGNOSIS — Z124 Encounter for screening for malignant neoplasm of cervix: Secondary | ICD-10-CM

## 2021-04-04 NOTE — Progress Notes (Signed)
Patient: Carrie Richardson           Date of Birth: [birth year]           MRN: [id number] Visit Date: 04/04/2021 PCP: Patient, No Pcp Per (Inactive)  Cervical Cancer Screening Do you smoke?: No Have you ever had or been told you have an allergy to latex products?: No Marital status: Married (legally separated) Date of last pap smear: 1-2 yrs ago (09/17/2019) Date of last menstrual period:  (IUD) Number of pregnancies: 1 Number of births: 1 Have you ever had any of the following? Hysterectomy: No Tubal ligation (tubes tied): No Abnormal bleeding: No Abnormal pap smear: Yes (09/17/2019 ASC-US/+HPV ([facility]), See Hx) Venereal warts: No A sex partner with venereal warts: No A high risk* sex partner: No  Cervical Exam  Abnormal Observations: Cervix friable. Thick white creamy discharge observed in vagina. Wet prep completed.  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 Per patient has a history of one other abnormal Pap smear when she was 46 or 36 years old that a repeat Pap smear was completed for follow-up. Last two Pap smear and colposcopy results are in Epic. Informed patient that if today's Pap smear is normal and HPV negative that her next Pap smear will be due in one year due to her history of abnormal Pap smears. Let patient know will call her within the next week with results of her wet prep and Pap smear by phone. Patient verbalized understanding.     Patient's History Patient Active Problem List   Diagnosis Date Noted   ASCUS with positive high risk HPV cervical 10/14/2018   Past Medical History:  Diagnosis Date   Abnormal Pap smear    Allergy     Family History  Problem Relation Age of Onset    Diabetes Maternal Grandmother    Cancer Maternal Grandmother        liver cancer   Diabetes Father    Hyperlipidemia Mother     Social History   Occupational History   Not on file  Tobacco Use   Smoking status: Never   Smokeless tobacco: Never  Vaping Use   Vaping Use: Never used  Substance and Sexual Activity   Alcohol use: No   Drug use: No   Sexual activity: Yes    Birth control/protection: I.U.D.

## 2021-04-04 NOTE — Addendum Note (Signed)
Addended by: Narda Rutherford on: 04/04/2021 09:15 AM   Modules accepted: Orders

## 2021-04-05 LAB — CERVICOVAGINAL ANCILLARY ONLY
Bacterial Vaginitis (gardnerella): POSITIVE — AB
Candida Glabrata: NEGATIVE
Candida Vaginitis: NEGATIVE
Chlamydia: NEGATIVE
Comment: NEGATIVE
Comment: NEGATIVE
Comment: NEGATIVE
Comment: NEGATIVE
Comment: NEGATIVE
Comment: NORMAL
Neisseria Gonorrhea: NEGATIVE
Trichomonas: NEGATIVE

## 2021-04-05 MED ORDER — METRONIDAZOLE 500 MG PO TABS: 500.0000 mg | ORAL_TABLET | Freq: Two times a day (BID) | ORAL | 0 refills | Status: DC

## 2021-04-05 NOTE — Progress Notes (Signed)
Patient is not able to swallow pills, needs liquid or vaginal gel.   Thank you,  Clois Dupes, LPN

## 2021-04-05 NOTE — Addendum Note (Signed)
Addended by: Catalina Antigua on: 04/05/2021 11:57 AM   Modules accepted: Orders

## 2021-04-09 ENCOUNTER — Telehealth: Payer: Self-pay

## 2021-04-09 ENCOUNTER — Other Ambulatory Visit: Payer: Self-pay | Admitting: Obstetrics and Gynecology

## 2021-04-09 MED ORDER — METRONIDAZOLE 0.75 % VA GEL: 1.0000 | Freq: Every day | VAGINAL | 1 refills | Status: DC

## 2021-04-09 NOTE — Telephone Encounter (Signed)
Per Dr. Elly Modena, Metrogel E-Scribed to pharmacy ([facility]), as patient was not able to swallow pills (Previous rx was metronidazole pills). Patient informed.

## 2021-04-10 ENCOUNTER — Telehealth: Payer: Self-pay

## 2021-04-10 LAB — CYTOLOGY - PAP
Comment: NEGATIVE
Comment: NEGATIVE
Diagnosis: UNDETERMINED — AB
HPV 16: NEGATIVE
HPV 18 / 45: NEGATIVE
High risk HPV: POSITIVE — AB

## 2021-04-10 NOTE — Telephone Encounter (Signed)
Patient informed pap results, ASC-US/+HPV. BCCCP and colposcopy appointment scheduled for 06/14/2021.

## 2021-04-12 NOTE — Progress Notes (Signed)
        Needs colpo

## 2021-06-14 ENCOUNTER — Encounter: Payer: Self-pay | Admitting: Obstetrics and Gynecology

## 2021-06-14 ENCOUNTER — Ambulatory Visit: Payer: Self-pay | Admitting: *Deleted

## 2021-06-14 ENCOUNTER — Ambulatory Visit (INDEPENDENT_AMBULATORY_CARE_PROVIDER_SITE_OTHER): Payer: Self-pay | Admitting: Obstetrics and Gynecology

## 2021-06-14 ENCOUNTER — Other Ambulatory Visit (HOSPITAL_COMMUNITY)
Admission: RE | Admit: 2021-06-14 | Discharge: 2021-06-14 | Disposition: A | Discharge status: Home / Self Care | Payer: Medicaid Other | Source: Ambulatory Visit | Attending: Obstetrics and Gynecology | Admitting: Obstetrics and Gynecology

## 2021-06-14 VITALS — BP 110/72 | Wt 171.3 lb

## 2021-06-14 DIAGNOSIS — R87610 Atypical squamous cells of undetermined significance on cytologic smear of cervix (ASC-US): Secondary | ICD-10-CM | POA: Diagnosis present

## 2021-06-14 DIAGNOSIS — R87810 Cervical high risk human papillomavirus (HPV) DNA test positive: Secondary | ICD-10-CM | POA: Insufficient documentation

## 2021-06-14 DIAGNOSIS — T8332XA Displacement of intrauterine contraceptive device, initial encounter: Secondary | ICD-10-CM | POA: Insufficient documentation

## 2021-06-14 DIAGNOSIS — Z1239 Encounter for other screening for malignant neoplasm of breast: Secondary | ICD-10-CM

## 2021-06-14 LAB — POCT PREGNANCY, URINE: Preg Test, Ur: NEGATIVE

## 2021-06-14 NOTE — Patient Instructions (Signed)
Colposcopy, Care After  The following information offers guidance on how to care for yourself after your procedure. Your doctor may also give you more specific instructions. If you have problems or questions, contact your doctor. What can I expect after the procedure? If you did not have a sample of your tissue taken out (did not have a biopsy), you may only have some spotting of blood for a few days. You can go back to your normal activities. If you had a sample of your tissue taken out, it is common to have: Soreness and mild pain. These may last for a few days. Mild bleeding or fluid (discharge) coming from your vagina. The fluid will look dark and grainy. You may have this for a few days. The fluid may be caused by a liquid that was used during your procedure. You may need to wear a sanitary pad. Spotting of blood for at least 48 hours after the procedure. Follow these instructions at home: Medicines Take over-the-counter and prescription medicines only as told by your doctor. Ask your doctor what over-the-counter pain medicines and prescription medicines you can start taking again. This is very important if you take blood thinners. Activity For at least 3 days, or for as long as told by your doctor, avoid: Douching. Using tampons. Having sex. Return to your normal activities as told by your doctor. Ask your doctor what activities are safe for you. General instructions Ask your doctor if you may take baths, swim, or use a hot tub. You may take showers. If you use birth control (contraception), keep using it. Keep all follow-up visits. Contact a doctor if: You have a fever or chills. You faint or feel light-headed. Get help right away if: You bleed a lot from your vagina. A lot of bleeding means that the bleeding soaks through a pad in less than 1 hour. You have clumps of blood (blood clots) coming from your vagina. You have signs that could mean you have an infection. This may be  fluid coming from your vagina that is: Different than normal. Yellow. Bad-smelling. You have very bad pain or cramps in your lower belly that do not get better with medicine. Summary If you did not have a sample of your tissue taken out, you may only have some spotting of blood for a few days. You can go back to your normal activities. If you had a sample of your tissue taken out, it is common to have mild pain for a few days and spotting for 48 hours. Avoid douching, using tampons, and having sex for at least 3 days after the procedure or for as long as told. Get help right away if you have a lot of bleeding, very bad pain, or signs of infection. This information is not intended to replace advice given to you by your health care provider. Make sure you discuss any questions you have with your health care provider. Document Revised: 07/02/2020 Document Reviewed: 07/02/2020 Elsevier Patient Education  2023 Elsevier Inc.  

## 2021-06-14 NOTE — Progress Notes (Addendum)
Carrie Richardson is a 36 y.o. female who presents to [facility] clinic today with no complaints. Patient referred to [facility] due to having an abnormal Pap smear on 04/04/2021 that a colposcopy is recommended for follow-up. ?  ?Pap Smear: Pap smear not completed today. Last Pap smear was 04/04/2021 at the free cervical cancer screening clinic and was abnormal - ASCUS with positive HPV . 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 Last three Pap smears and two colposcopy results are available in Epic. ?  ?Physical exam: ?Breasts ?Breasts symmetrical. No skin abnormalities bilateral breasts. No nipple retraction bilateral breasts. No nipple discharge bilateral breasts. No lymphadenopathy. No lumps palpated bilateral breasts. No complaints of pain or tenderness on exam. Screening mammogram recommended at age 9 unless clinically indicated prior.       ?  ?Pelvic/Bimanual ?Pap is not indicated today per [facility] guidelines. ?  ?Smoking History: ?Patient has never smoked. ? ?Patient Navigation: ?Patient education provided. Access to services provided for patient through [facility] program. Patient has food insecurities. Patient escorted to the food market at the [facility] for groceries.  ?  ?Breast and Cervical Cancer Risk Assessment: ?Patient does not have family history of breast cancer, known genetic mutations, or radiation treatment to the chest before age 73. Patient has history of cervical dysplasia. Patient has no history of being immunocompromised or DES exposure in-utero. ? ?Risk Assessment   ? ? Risk Scores   ? ?   06/14/2021 02/29/2020  ? Last edited by: Royston Bake,  CMA McGill, Karlton Lemon, LPN  ? 5-year risk: 0.3 %   ? Lifetime risk: 9.2 %   ? ?  ?  ? ?  ? ? ?A: ?[facility] exam without pap smear ?No complaints. ? ?P: ?Referred patient to [facility] for [place] for a colposcopy to follow-up for her abnormal Pap smear. Appointment scheduled Thursday, June 14, 2021 at [place] ? ?Loletta Parish, RN ?06/14/2021 8:56 AM   ?

## 2021-06-14 NOTE — Progress Notes (Signed)
110/72

## 2021-06-14 NOTE — Patient Instructions (Signed)
Explained breast self awareness with Estanislado Spire. Patient did not need a Pap smear today due to last Pap smear was 04/04/2021. Explained the colposcopy the recommended follow up for her abnormal Pap smear. Referred patient to [facility] for [facility] for a colposcopy to follow-up for her abnormal Pap smear. Appointment scheduled Thursday, June 14, 2021 at 0935. Patient aware of appointment and will be there. Let patient know a screening mammogram is recommended at age 52 unless clinically indicated prior. Estanislado Spire verbalized understanding. ? ?Londin Antone, Kathaleen Maser, RN ?8:56 AM ? ? ? ? ?

## 2021-06-14 NOTE — Progress Notes (Signed)
? ? ?  GYNECOLOGY CLINIC COLPOSCOPY PROCEDURE NOTE ? ?36 y.o. G1P1001 here for colposcopy for ASCUS with POSITIVE high risk HPV pap smear on 2/23. Discussed role for HPV in cervical dysplasia, need for surveillance. ? ?Patient given informed consent, signed copy in the chart, time out was performed.  Placed in lithotomy position. Cervix viewed with speculum and colposcope after application of acetic acid.  ? ?Colposcopy adequate? Yes ? no visible lesions; random biopsies obtained.  ECC specimen obtained. Monsel's applied. Unable to see IUD strings ?All specimens were labelled and sent to pathology. ? ?Patient was given post procedure instructions.  Will follow up pathology and manage accordingly.  Routine preventative health maintenance measures emphasized. Will check GYN U/S for lost IUD strings. Discussed further management of her chronic pap smears. Pt is interested in treatment. Reviewed cryo of cervix, LEEP and vag hyst.  ?Will discuss in more detail follow ing these results.  ? ? ? ?Nettie Elm MD, FACOG ?Attending Obstetrician & Gynecologist ?Center for [employer], [facility] ? ? ? ? ?  ?

## 2021-06-18 LAB — SURGICAL PATHOLOGY

## 2021-06-21 ENCOUNTER — Telehealth: Payer: Self-pay | Admitting: *Deleted

## 2021-06-21 NOTE — Telephone Encounter (Signed)
Patient called and left message asking if transvaginal ultrasound covered by [facility]. Patient was referred for a transvaginal ultrasound due to provider unable to locate IUD strings and check for uterine fibroids. Called patient back and let her know procedure is not covered by [facility]. Told patient about the [facility] Application and where to locate online. Patient verbalized understanding. ?

## 2021-06-22 ENCOUNTER — Ambulatory Visit (HOSPITAL_COMMUNITY): Payer: Self-pay

## 2021-06-22 ENCOUNTER — Encounter (HOSPITAL_COMMUNITY): Payer: Self-pay

## 2021-07-19 ENCOUNTER — Encounter: Payer: Self-pay | Admitting: Obstetrics and Gynecology

## 2021-07-19 ENCOUNTER — Ambulatory Visit (INDEPENDENT_AMBULATORY_CARE_PROVIDER_SITE_OTHER): Payer: Medicaid Other | Admitting: Obstetrics and Gynecology

## 2021-07-19 VITALS — BP 117/80 | HR 88 | Ht 67.0 in | Wt 173.1 lb

## 2021-07-19 DIAGNOSIS — Z30431 Encounter for routine checking of intrauterine contraceptive device: Secondary | ICD-10-CM | POA: Diagnosis not present

## 2021-07-19 DIAGNOSIS — T8332XA Displacement of intrauterine contraceptive device, initial encounter: Secondary | ICD-10-CM

## 2021-07-19 DIAGNOSIS — R87610 Atypical squamous cells of undetermined significance on cytologic smear of cervix (ASC-US): Secondary | ICD-10-CM

## 2021-07-19 DIAGNOSIS — R87810 Cervical high risk human papillomavirus (HPV) DNA test positive: Secondary | ICD-10-CM

## 2021-07-19 NOTE — Progress Notes (Signed)
Carrie Richardson presents to discuss colpo results. Colpo Bx CIN 1.  See PL for H/O abnormal pap's and colpo  Discussed Tx options with pt  Pt is working on Youth worker.  Did not get GYN U/S due to financial reasons.  PE AF VSS Lungs clear Heart RRR Abd soft + BS  A/P CIN 1        Lost IUD strings  Pt desires LEEP. Will complete financial aid package. Then reorder GYN U/S for lost IUD strings and schedule LEEP.

## 2021-07-19 NOTE — Patient Instructions (Signed)
Loop Electrosurgical Excision Procedure Loop electrosurgical excision procedure (LEEP) is the cutting and removal (excision) of tissue from the cervix. The cervix is the bottom part of the uterus that opens into the vagina. The tissue that is removed from the cervix is examined to see if there are cancer cells or cells that might turn into cancer (precancerous cells). LEEP may be done when: You have abnormal bleeding from your cervix. You have an abnormal Pap test result. Your health care provider finds abnormalities on your cervix during an exam. LEEP typically only takes a few minutes and is often done in the health care provider's office. The procedure is safe for women who are trying to get pregnant. The procedure is usually not done during a menstrual period or during pregnancy. Tell a health care provider about: Any allergies you have. All medicines you are taking, including vitamins, herbs, eye drops, creams, and over-the-counter medicines. Any problems you or family members have had with anesthetic medicines. Any bleeding problems you have. Any medical conditions you have or have had. This includes current or past vaginal infections, such as herpes or STIs (sexually transmitted infections). Whether you are pregnant or may be pregnant. If you are having vaginal bleeding on the day of the procedure. What are the risks? Generally, this is a safe procedure. However, problems may occur, including: Infection. Bleeding. Allergic reactions to medicines. Changes or scarring in the cervix. Damage to nearby structures or organs. Increased risk of early (preterm) labor in future pregnancies. What happens before the procedure? Ask your health care provider about: Changing or stopping your regular medicines. This is especially important if you are taking diabetes medicines or blood thinners. Taking medicines such as aspirin and ibuprofen. These medicines can thin your blood. Do not take these  medicines unless your health care provider tells you to take them. Taking over-the-counter medicines, vitamins, herbs, and supplements. Your health care provider may recommend that you take pain medicine before the procedure. Ask your health care provider if you should plan to have a responsible adult take you home after the procedure. What happens during the procedure?  An instrument called a speculum will be placed in your vagina. This will allow your health care provider to see your cervix. You will be given a medicine to numb the area (local anesthetic). The medicine will be injected into your cervix and the surrounding area. A solution will be applied to your cervix. This solution will help the health care provider find the abnormal cells that need to be removed. A thin wire loop will be passed through your vagina to your cervix. The wire will remove layers of abnormal cervical cells. The wire will burn (cauterize) the cervical tissue with an electrical current during cell removal. Open blood vessels will be cauterized to prevent bleeding. You might feel some pressure, aching, and cramping. If you feel like you will faint during the procedure, tell your health care provider right away. A paste may be applied to the cauterized area of your cervix to help control bleeding. The sample of cervical tissue will be sent to a lab and looked at under a microscope. The procedure may vary among health care providers and hospitals. What can I expect after the procedure? After the procedure, it is common to have: Mild abdominal cramps that may last for up to 1 week. A small amount of pink-tinged or bloody vaginal discharge, including light to moderate bleeding, for 1-2 weeks. A brown- or black-colored discharge coming from your   vagina, if a paste was used on the cervix to control bleeding. It is up to you to get the results of your procedure. Ask your health care provider, or the department that is  doing the procedure, when your results will be ready. Follow these instructions at home: Take over-the-counter and prescription medicines only as told by your health care provider. Return to your normal activities as told by your health care provider. Ask your health care provider what activities are safe for you. Do not put anything in your vagina for 2 weeks after the procedure or until your health care provider says that it is okay. This includes tampons, creams, and douches. Do not have sex until your health care provider approves. Keep all follow-up visits. This is important. Contact a health care provider if: You have a fever or chills. You feel very weak. You have blood clots or bleeding that is heavier than a normal menstrual period. Bleeding that soaks a pad in less than 1 hour is considered heavy bleeding. You develop a bad-smelling discharge from your vagina. You have severe abdominal pain or cramping. Summary Loop electrosurgical excision procedure (LEEP) is the removal of tissue from the cervix. The removed tissue will be checked for precancerous cells or cancer cells. LEEP typically only takes a few minutes and is often done in your health care provider's office. Do not put anything in your vagina for 2 weeks after the procedure or until your health care provider says that it is okay. This includes tampons, creams, and douches. Ask your health care provider, or the department that is doing the procedure, when your results will be ready. This information is not intended to replace advice given to you by your health care provider. Make sure you discuss any questions you have with your health care provider. Document Revised: 07/12/2020 Document Reviewed: 07/12/2020 Elsevier Patient Education  2023 Elsevier Inc. LEEP POST-PROCEDURE INSTRUCTIONS  You may take Ibuprofen, Aleve or Tylenol for pain if needed.  Cramping is normal.  You will have black and/or bloody discharge at first.   This will lighten and then turn clear before completely resolving.  This will take 2 to 3 weeks.  Put nothing in your vagina until the bleeding or discharge stops (usually 2 or3 days).  You need to call if you have redness around the biopsy site, if there is any unusual draining, if the bleeding is heavy, or if you are concerned.  Shower or bathe as normal  We will call you within one week with results or we will discuss the results at your follow-up appointment if needed.  You will need to return for a follow-up Pap smear as directed by your physician. 

## 2021-09-11 ENCOUNTER — Other Ambulatory Visit: Payer: Self-pay | Admitting: Obstetrics and Gynecology

## 2021-09-11 DIAGNOSIS — T8332XA Displacement of intrauterine contraceptive device, initial encounter: Secondary | ICD-10-CM

## 2022-09-17 ENCOUNTER — Ambulatory Visit: Payer: Medicaid Other

## 2022-12-17 ENCOUNTER — Other Ambulatory Visit (HOSPITAL_COMMUNITY)
Admission: RE | Admit: 2022-12-17 | Discharge: 2022-12-17 | Disposition: A | Discharge status: Home / Self Care | Payer: Medicaid Other | Source: Ambulatory Visit | Attending: Obstetrics and Gynecology | Admitting: Obstetrics and Gynecology

## 2022-12-17 ENCOUNTER — Other Ambulatory Visit: Payer: Medicaid Other | Admitting: Hematology and Oncology

## 2022-12-17 DIAGNOSIS — Z124 Encounter for screening for malignant neoplasm of cervix: Secondary | ICD-10-CM

## 2022-12-17 NOTE — Progress Notes (Signed)
Patient: Carrie Richardson           Date of Birth: June 06, 1985           MRN: [id number] Visit Date: 12/17/2022 PCP: Default, Provider, MD     Cervical Exam Pap smear completed: Pap test Abnormal Observations: Normal Recommendations: Repeat Pap smear in 1 year even  if negative HPV and normal, due to history of ASCUS/ HPV+. Recent biopsy reveals LSIL.      Patient's History Patient Active Problem List   Diagnosis Date Noted   IUD strings lost 06/14/2021   ASCUS with positive high risk HPV cervical 10/14/2018   Past Medical History:  Diagnosis Date   Abnormal Pap smear    Allergy     Family History  Problem Relation Age of Onset   Diabetes Maternal Grandmother    Cancer Maternal Grandmother        liver cancer   Diabetes Father    Hyperlipidemia Mother     Social History   Occupational History   Not on file  Tobacco Use   Smoking status: Never   Smokeless tobacco: Never  Vaping Use   Vaping status: Never Used  Substance and Sexual Activity   Alcohol use: No   Drug use: No   Sexual activity: Yes    Birth control/protection: I.U.D.

## 2022-12-24 LAB — CYTOLOGY - PAP
Comment: NEGATIVE
Diagnosis: NEGATIVE
High risk HPV: NEGATIVE
